# Patient Record
Sex: Female | Born: 1975 | Hispanic: Yes | Marital: Married | Smoking: Never smoker
Health system: Southern US, Community
[De-identification: ages and names within clinical notes are randomized; demographics above are authoritative.]

## PROBLEM LIST (undated history)

## (undated) DIAGNOSIS — Z6841 Body Mass Index (BMI) 40.0 and over, adult: Secondary | ICD-10-CM

## (undated) DIAGNOSIS — O09529 Supervision of elderly multigravida, unspecified trimester: Secondary | ICD-10-CM

## (undated) DIAGNOSIS — O24419 Gestational diabetes mellitus in pregnancy, unspecified control: Secondary | ICD-10-CM

## (undated) SURGERY — Surgical Case
Anesthesia: *Unknown

---

## 1998-11-29 DIAGNOSIS — O321XX0 Maternal care for breech presentation, not applicable or unspecified: Secondary | ICD-10-CM

## 2007-03-31 ENCOUNTER — Observation Stay: Payer: Self-pay | Admitting: Obstetrics and Gynecology

## 2008-09-23 ENCOUNTER — Ambulatory Visit: Payer: Self-pay | Admitting: Internal Medicine

## 2010-02-04 ENCOUNTER — Ambulatory Visit: Payer: Self-pay | Admitting: Internal Medicine

## 2010-03-18 ENCOUNTER — Emergency Department: Payer: Self-pay | Admitting: Emergency Medicine

## 2013-08-08 ENCOUNTER — Emergency Department: Payer: Self-pay | Admitting: Emergency Medicine

## 2013-08-08 LAB — URINALYSIS, COMPLETE
BILIRUBIN, UR: NEGATIVE
Bacteria: NONE SEEN
Blood: NEGATIVE
GLUCOSE, UR: NEGATIVE mg/dL (ref 0–75)
Leukocyte Esterase: NEGATIVE
NITRITE: NEGATIVE
Ph: 6 (ref 4.5–8.0)
Protein: NEGATIVE
Specific Gravity: 1.018 (ref 1.003–1.030)
Squamous Epithelial: 1
WBC UR: <1 /HPF (ref 0–5)

## 2013-08-08 LAB — CBC WITH DIFFERENTIAL/PLATELET
BASOS ABS: 0 10*3/uL (ref 0.0–0.1)
BASOS PCT: 0.3 %
EOS PCT: 0.5 %
Eosinophil #: 0.1 10*3/uL (ref 0.0–0.7)
HCT: 41.8 % (ref 35.0–47.0)
HGB: 13.8 g/dL (ref 12.0–16.0)
LYMPHS ABS: 0.5 10*3/uL — AB (ref 1.0–3.6)
Lymphocyte %: 4.6 %
MCH: 30.2 pg (ref 26.0–34.0)
MCHC: 33 g/dL (ref 32.0–36.0)
MCV: 92 fL (ref 80–100)
MONO ABS: 0.3 x10 3/mm (ref 0.2–0.9)
Monocyte %: 2.7 %
NEUTROS PCT: 91.9 %
Neutrophil #: 9.1 10*3/uL — ABNORMAL HIGH (ref 1.4–6.5)
Platelet: 248 10*3/uL (ref 150–440)
RBC: 4.57 10*6/uL (ref 3.80–5.20)
RDW: 12.4 % (ref 11.5–14.5)
WBC: 9.9 10*3/uL (ref 3.6–11.0)

## 2013-08-08 LAB — COMPREHENSIVE METABOLIC PANEL
Albumin: 3.9 g/dL (ref 3.4–5.0)
Alkaline Phosphatase: 97 U/L
Anion Gap: 4 — ABNORMAL LOW (ref 7–16)
BUN: 11 mg/dL (ref 7–18)
Bilirubin,Total: 0.6 mg/dL (ref 0.2–1.0)
CO2: 29 mmol/L (ref 21–32)
Calcium, Total: 8.8 mg/dL (ref 8.5–10.1)
Chloride: 102 mmol/L (ref 98–107)
Creatinine: 0.76 mg/dL (ref 0.60–1.30)
EGFR (African American): >60
GLUCOSE: 125 mg/dL — AB (ref 65–99)
Osmolality: 271 (ref 275–301)
Potassium: 3.6 mmol/L (ref 3.5–5.1)
SGOT(AST): 162 U/L — ABNORMAL HIGH (ref 15–37)
SGPT (ALT): 144 U/L — ABNORMAL HIGH (ref 12–78)
SODIUM: 135 mmol/L — AB (ref 136–145)
Total Protein: 8.2 g/dL (ref 6.4–8.2)

## 2013-08-08 LAB — LIPASE, BLOOD: Lipase: 122 U/L (ref 73–393)

## 2013-08-08 LAB — PREGNANCY, URINE: PREGNANCY TEST, URINE: NEGATIVE m[IU]/mL

## 2014-03-07 ENCOUNTER — Emergency Department: Payer: Self-pay | Admitting: Emergency Medicine

## 2014-03-07 LAB — URINALYSIS, COMPLETE
BACTERIA: NONE SEEN
BILIRUBIN, UR: NEGATIVE
Blood: NEGATIVE
Glucose,UR: NEGATIVE mg/dL (ref 0–75)
Ketone: NEGATIVE
Leukocyte Esterase: NEGATIVE
Nitrite: NEGATIVE
PH: 7 (ref 4.5–8.0)
Protein: NEGATIVE
RBC, UR: NONE SEEN /HPF (ref 0–5)
SPECIFIC GRAVITY: 1.013 (ref 1.003–1.030)
Squamous Epithelial: 1
WBC UR: 1 /HPF (ref 0–5)

## 2014-03-07 LAB — CBC
HCT: 37 % (ref 35.0–47.0)
HGB: 12.5 g/dL (ref 12.0–16.0)
MCH: 31.2 pg (ref 26.0–34.0)
MCHC: 33.8 g/dL (ref 32.0–36.0)
MCV: 92 fL (ref 80–100)
PLATELETS: 277 10*3/uL (ref 150–440)
RBC: 4.01 10*6/uL (ref 3.80–5.20)
RDW: 13 % (ref 11.5–14.5)
WBC: 11.3 10*3/uL — ABNORMAL HIGH (ref 3.6–11.0)

## 2014-03-07 LAB — GC/CHLAMYDIA PROBE AMP

## 2014-03-07 LAB — HCG, QUANTITATIVE, PREGNANCY: Beta Hcg, Quant.: 26753 m[IU]/mL — ABNORMAL HIGH

## 2014-03-07 LAB — WET PREP, GENITAL

## 2014-03-21 ENCOUNTER — Encounter: Payer: Self-pay | Admitting: Obstetrics and Gynecology

## 2014-04-11 ENCOUNTER — Encounter: Payer: Self-pay | Admitting: Obstetrics and Gynecology

## 2014-04-22 ENCOUNTER — Encounter: Payer: Self-pay | Admitting: Maternal & Fetal Medicine

## 2014-04-30 ENCOUNTER — Ambulatory Visit: Payer: Self-pay | Admitting: Maternal & Fetal Medicine

## 2014-05-09 ENCOUNTER — Encounter: Payer: Self-pay | Admitting: Maternal & Fetal Medicine

## 2014-05-23 ENCOUNTER — Encounter: Payer: Self-pay | Admitting: Obstetrics and Gynecology

## 2014-05-31 ENCOUNTER — Ambulatory Visit: Payer: Self-pay | Admitting: Maternal & Fetal Medicine

## 2014-05-31 DIAGNOSIS — O24419 Gestational diabetes mellitus in pregnancy, unspecified control: Secondary | ICD-10-CM

## 2014-05-31 HISTORY — DX: Gestational diabetes mellitus in pregnancy, unspecified control: O24.419

## 2014-05-31 NOTE — L&D Delivery Note (Signed)
Delivery Note At 3:40 AM a viable female was delivered via VBAC, Spontaneous (Presentation: Right Occiput Anterior ).  APGAR: 8,9 ; weight 7 lb 14 oz (3572 g).   Placenta status: Spontaneous, .  Cord: 3 vessels with the following complications: nuchal and body cord .   Anesthesia: None  Episiotomy: None Lacerations: None (Vagina and cervix explored with no lacerations noted) Suture Repair: None  Est. Blood Loss (mL): 350  Mom to postpartum.  Baby to Couplet care / Skin to Skin.    Margrett Kalb 10/19/2014, 4:40 AM

## 2014-06-20 ENCOUNTER — Encounter: Payer: Self-pay | Admitting: Obstetrics & Gynecology

## 2014-06-25 ENCOUNTER — Encounter: Payer: Self-pay | Admitting: Pediatric Cardiology

## 2014-07-01 ENCOUNTER — Encounter: Payer: Self-pay | Admitting: Obstetrics and Gynecology

## 2014-08-01 ENCOUNTER — Encounter
Admit: 2014-08-01 | Disposition: A | Discharge status: Home / Self Care | Payer: Self-pay | Attending: Maternal & Fetal Medicine | Admitting: Maternal & Fetal Medicine

## 2014-08-26 ENCOUNTER — Encounter
Admit: 2014-08-26 | Disposition: A | Discharge status: Home / Self Care | Payer: Self-pay | Admitting: Maternal & Fetal Medicine

## 2014-08-30 ENCOUNTER — Encounter
Admit: 2014-08-30 | Disposition: A | Discharge status: Home / Self Care | Payer: Self-pay | Attending: Maternal & Fetal Medicine | Admitting: Maternal & Fetal Medicine

## 2014-09-02 ENCOUNTER — Encounter
Admit: 2014-09-02 | Disposition: A | Discharge status: Home / Self Care | Payer: Self-pay | Attending: Obstetrics and Gynecology | Admitting: Obstetrics and Gynecology

## 2014-09-05 ENCOUNTER — Encounter
Admit: 2014-09-05 | Disposition: A | Discharge status: Home / Self Care | Payer: Self-pay | Attending: Maternal & Fetal Medicine | Admitting: Maternal & Fetal Medicine

## 2014-09-09 ENCOUNTER — Encounter
Admit: 2014-09-09 | Disposition: A | Discharge status: Home / Self Care | Payer: Self-pay | Attending: Maternal & Fetal Medicine | Admitting: Maternal & Fetal Medicine

## 2014-09-11 LAB — OB RESULTS CONSOLE ABO/RH: RH Type: POSITIVE

## 2014-09-11 LAB — OB RESULTS CONSOLE VARICELLA ZOSTER ANTIBODY, IGG: VARICELLA IGG: IMMUNE

## 2014-09-11 LAB — OB RESULTS CONSOLE HEPATITIS B SURFACE ANTIGEN: Hepatitis B Surface Ag: NEGATIVE

## 2014-09-11 LAB — OB RESULTS CONSOLE RUBELLA ANTIBODY, IGM: RUBELLA: IMMUNE

## 2014-09-11 LAB — OB RESULTS CONSOLE RPR: RPR: NONREACTIVE

## 2014-09-11 LAB — OB RESULTS CONSOLE GC/CHLAMYDIA
CHLAMYDIA, DNA PROBE: NEGATIVE
Gonorrhea: NEGATIVE

## 2014-09-11 LAB — OB RESULTS CONSOLE ANTIBODY SCREEN: Antibody Screen: NEGATIVE

## 2014-09-11 LAB — OB RESULTS CONSOLE HIV ANTIBODY (ROUTINE TESTING): HIV: NONREACTIVE

## 2014-09-12 ENCOUNTER — Encounter
Admit: 2014-09-12 | Disposition: A | Discharge status: Home / Self Care | Payer: Self-pay | Attending: Obstetrics & Gynecology | Admitting: Obstetrics & Gynecology

## 2014-09-16 ENCOUNTER — Encounter
Admit: 2014-09-16 | Disposition: A | Discharge status: Home / Self Care | Payer: Self-pay | Attending: Obstetrics and Gynecology | Admitting: Obstetrics and Gynecology

## 2014-09-19 ENCOUNTER — Encounter
Admit: 2014-09-19 | Disposition: A | Discharge status: Home / Self Care | Payer: Self-pay | Attending: Maternal & Fetal Medicine | Admitting: Maternal & Fetal Medicine

## 2014-09-21 NOTE — Consult Note (Signed)
Referral Information:  Reason for Referral Gestational diabetes and advanced maternal age - follow up of blood sugars   Referring Physician [facility] Dr. Presley Raddle   Prenatal Hx 39 yo [phone] married Latina female seen with Spanish Interpreter. She is  now at [redacted]w[redacted]d based on LMP 01/05/2014, [facility] 10/22/2014 (ultrasound on 03/07/2014 she was [redacted]w[redacted]d with [facility] 10/19/2013).  She had an early elevated AIC of 6.7 and was referred for management of diabetes this pregnancy (history of GDM prior pregnancy).   Past Obstetrical Hx 1.  2000 cesarean for breech [facility]; no preex or GDM; 8 lbs female 2.  2003 SVD at [facility]; no preex or GDM; 9 lbs female  3.  2009 SVD at [facility], no preex; GDM treated with diet only; 9 lbs female 4.  2013 6 week sab 5.  Current   Home Medications: Medication Instructions Status  Prenatal Multivitamins Prenatal Multivitamins oral tablet 1 tab(s) orally once a day Active   Allergies:   No Known Allergies:   No Known Allergies:   Vital Signs/Notes:  Nursing Vital Signs:  **Vital Signs.:   12-Nov-15 13:16  Vital Signs Type Routine  Temperature Temperature (F) 98.4  Celsius 36.8  Temperature Source oral  Pulse Pulse 75  Respirations Respirations 18  Systolic BP Systolic BP 108  Diastolic BP (mmHg) Diastolic BP (mmHg) 52  Mean BP 70  Pulse Ox % Pulse Ox % 100  Pulse Ox Activity Level  At rest  Oxygen Delivery Room Air/ 21 %   Perinatal Consult:  LMP 15-Jan-2014   PGyn Hx regular menses   Past Medical History cont'd obesity distant h/o chlamydia   PSurg Hx cesarean 2000   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  Gestational DM - likely type 2 with elevated hgb A1c.  BS range widely. 2.  Cesarean x 1 followed by 2 VBAC  of 9 lb infants  3.  Advanced maternal age 63.  Elevated BMI   Recommendations 1.  [name] was referred to [facility] and has tried to make adjustments to her diet (now eating about 3 tortillas daily down from about 8) and has been checking fasting blood sugars and qhs blood sugars.  No postprandials were recorded.  She states that she eats 2 meals/day at work and those meal times are her only breaks so she cannot check her BS during the day. 2.  Will continue checking fasting blood sugars and will start checking 2hr post dinner (she usually eats dinner at home around 5pm) and 2hr post prandials on her 2 days off/week.  She was also encouraged to start walking in the evenings if possible and to continue to monitor her diet (increase her protein and decrease her carbs).  I did NOT start glyburide today although we discussed that it may be necessary.  Will try and get a better assessment of her postprandials on the weekends and evenings before starting. 3.  She desires VBAC - discussed if fetus was over 4.5k that cesarean woudl be recommended  4.  She had GC today and first trimester ultrasound - she declined aneuploidy screening or testing.  Will schedule follow up ultrasound in about 6 weeks for evaluation of fetal anatomy.  Would also recommend screening  fetal echocardiogram at about 22 weeks.  5. She was previously advised her to limit weight gain to 10 lbs given increased BMI. 6. RTC 2 wks to review BS - will [facility]. 7. Offer starting baby ASA next visit.   Plan:  Prenatal Diagnosis Options Level II US   Ultrasound at what gestational ages Monthly > 28 weeks   Antepartum Testing Twice weekly, Starting at 32 weeks, if on medication   Delivery Mode depends on EFW   Additional [facility]   Delivery at what gestational age [redacted] weeks, Or earlier if evidence of fetopathy    Total Time Spent with Patient 30 minutes    >50% of visit spent in couseling/coordination of care yes   Office Use Only 99214  Office Visit Level 4 (25min) EST detailed office/outpt   Coding Description: MATERNAL CONDITIONS/HISTORY INDICATION(S).   Abnormal glucose tolerance - if known by anatomy scan.   Adv. Maternal Age - multigravida.   Previous C-section.  Electronic Signatures: Kirby FunkEllestad, Edman Lipsey (MD)  (Signed 12-Nov-15 15:33)  Authored: Referral, Home Medications, Allergies, Vital Signs/Notes, Consult, Lab/Radiology Notes, Impression, Plan, Billing, Coding Description   Last Updated: 12-Nov-15 15:33 by Kirby FunkEllestad, Shaneika Rossa (MD)

## 2014-09-21 NOTE — Consult Note (Signed)
Referral Information:  Reason for Referral Gestational diabetes and advanced maternal age - follow up of blood sugars   [facility] - Dr. Presley RaddleAdrian Mancheno   Prenatal Hx 39 yo [phone] married Latina female seen with Spanish Interpreter. She is now at 2531w2d based on LMP 01/15/2014, [facility] (On ultrasound 03/07/2014 [facility]  She had an early elevated AIC of 6.7 and was originally referred for management of diabetes this pregnancy (history of GDM prior pregnancy).   Past Obstetrical Hx 1.  2000 ces[facility] for breech [facility]; no preex or GDM; 8 lbs female 2.  [facility]; no preex or GDM; 9 lbs female  3.  [facility], [facility]; GDM treated with diet only; 9 lbs female 4.  2013 6 week sab 5.  Current   Home Medications: Medication Instructions Status  Prenatal Multivitamins Prenatal Multivitamins oral tablet 1 tab(s) orally once a day Active  metFORMIN 500 mg oral tablet 1 tab(s) orally 2 times a day Active  aspirin 81 mg oral tablet, chewable 1 tab(s) orally once a day Active   Allergies:   No Known Allergies:   Vital Signs/Notes:  Nursing Vital Signs: **Vital Signs.:   24-Dec-15 09:05  Vital Signs Type Routine  Temperature Temperature (F) 98.6  Celsius 37  Pulse Pulse 79  Respirations Respirations 18  Systolic BP Systolic BP 97  Diastolic BP (mmHg) Diastolic BP (mmHg) 58  Mean BP 71  Pulse Ox % Pulse Ox % 97  Pulse Ox Activity Level  At rest  Oxygen Delivery Room Air/ 21 %   Perinatal Consult:  LMP 15-Jan-2014   PGyn Hx regular menses   Past Medical History cont'd obesity distant h/o chlamydia   PSurg Hx cesarean 2000   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   Soc Hx married   Review Of Systems:  Fever/Chills No   Cough No   Abdominal Pain No   Diarrhea No   Constipation No   Nausea/Vomiting No   SOB/DOE No   Chest Pain No   Dysuria No   Tolerating Diet Yes   Medications/Allergies Reviewed  Medications/Allergies reviewed   Exam:  Today's Weight 218lb; BMI=35   Abdomen FHR = 140    Additional Lab/Radiology Notes Fasting blood sugars range from 78-111 with 5/15 > 95  Only 4/5 > 95 in the last week 2 hr postprandial breakfast range from 84-108 with 0/5 > 120 2 hr postprandial lunch range from 83-120 with 0/5 > 120 2 hr postprandial dinner range from 87-139 with 1/14 > 120   Impression/Recommendations:  Impression 39 yo G5P3013  at 2331w2d with: 1.  Gestational DM - likely type 2 with elevated hgb A1c.  Blood sugars initially improved on metformin.  Now with fasting hyperglycemia. 2.  Cesarean x 1 followed by 2 VBAC of 9 lb infants  3.  Advanced maternal age 89.  Elevated BMI   Recommendations 1. Gestational DM - likely type 2 with elevated hgb A1c. Blood sugars initially improved on metformin. Now with fasting hyperglycemia.  -  She should continue checking her fasting blood sugars daily and 2 hour postprandial blood sugars periodically.   - She should increase her metformin to 1000 mg po bid. This is the maximum dose.  We can  add insulin prn. - I have recommended she return in 4 weeks - We would also recommend screening fetal echocardiogram at about 22 weeks.  This was already ordered. - She was scheduled to  return here in 10 weeks for her first growth scan - Fetal surveillance as outlined below  2. Cesarean x 1 followed by 2 VBAC of 9 lb infants  -  She desires VBAC - Dr. Quin Hoop previously discussed if fetus was over 4.5k that cesarean would be recommended  3. Advanced maternal age -  She has had genetic counseling and declined any genetic testing. -  She is taking for ASA 81 mg po daily  4. Elevated BMI - She was previously advised her to limit weight gain to 10 lbs given increased BMI. 5. Routine OB care - She should [facility] to [facility]   Plan:  Prenatal Diagnosis Options Level II [place]   Ultrasound at what gestational ages Monthly > 28  weeks   Antepartum Testing Twice weekly, Starting at 32 weeks   Delivery Mode depends on EFW   Additional Testing pt had TFTs at [facility]   Delivery at what gestational age [redacted] weeks, Or earlier (ie 37 weeks) if evidence of fetopathy    Total Time Spent with Patient 15 minutes   >50% of visit spent in couseling/coordination of care yes   Office Use Only 99213  Office Visit Level 3 ( ) EST exp prob focused outpt   Coding Description: MATERNAL CONDITIONS/HISTORY INDICATION(S).   Abnormal glucose tolerance - if known by anatomy scan.   Adv. Maternal Age - multigravida.   Obesity - BMI greater than equal to 30.   Previous C-section.  Electronic Signatures: Lady Deutscher (MD)  (Signed 24-Dec-15 13:34)  Authored: Referral, Home Medications, Allergies, Vital Signs/Notes, Consult, Exam, Lab/Radiology Notes, Impression, Plan, Billing, Coding Description   Last Updated: 24-Dec-15 13:34 by Lady Deutscher (MD)

## 2014-09-21 NOTE — Consult Note (Signed)
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 I saw them with the Spanish interpreter. She is  now at 9w 3d LMP 01/05/2014 , [facility] 10/22/2014 (ultrasound on 03/07/2014 she was 7w 5d [facility] 10/19/2013)  pt has a hgb A1c of 6.7 at [facility].   She had GDM with her last pregnancy - she watched her diet and only checked her blood sugar inthe clinic. she has just started checking her blood sugar ( she is able to purchase strips).  She received soe  diet counseling and is trying to eat better.  She is obese with a BMI of 43 .   Past Obstetrical Hx 2000- cesarean for breech [facility] no preex or GDM 8 lbs female 2003 - spontaneous vaginal delivery 9 lbs female no GDM no preex 2009 spontaneous vaginal delivery 9 lbs no GDM or preex 2013 6 week sab   Home Medications:  Medication Instructions Status  Prenatal Multivitamins Prenatal Multivitamins oral tablet 1 tab(s) orally once a day Active   Allergies:   No Known Allergies:   No Known Allergies:   Vital Signs/Notes:  Nursing Vital Signs:  **Vital Signs.:   22-Oct-15 08:46  Vital Signs Type Routine  Celsius 98.1  Temperature Source oral  Pulse Pulse 69  Respirations Respirations 18  Systolic BP Systolic BP 106  Diastolic BP (mmHg) Diastolic BP (mmHg) 60  Mean BP 75  Pulse Ox % Pulse Ox % 100  Pulse Ox Activity Level  At rest  Oxygen Delivery Room Air/ 21 %   Perinatal Consult:  LMP 15-Jan-2014   PGyn Hx regular menses    Past Medical History cont'd obesity distant h/o chlamydia   PSurg Hx cesarean 2000   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   Soc Hx married   Review Of Systems:  Abdominal Pain No    Tolerating Diet Yes     Additional Lab/Radiology Notes RBS 77 after breakfast  other BS 160, 107, 111, 119 pt recorded   Impression/Recommendations:   Impression IUP at 9 w 3d 1 gest Dm - likely type 2 with elevated hgb A1c good sugars today on check 2 cesarean x1 followed by 2 VBAC of 9 lb infants  3 adv mat age 89 Obesity   Recommendations 1 refer to [facility] for instruction in diet and glucose monitoirng  2 I counseled them  about risks of Diabetes including birth defects, macrosomia, birth trauma, neonatal hypoglycemia , IUFD and encouraged glucose control to decrease risk 3 pt desires VBAC - discussed if fetus was over 4.5k that cesarean woudl be recommended  4 I offered genetic counseling in 3 weeks - pt says she likely is nto interested in any testing for chromosomal problems 5 I advised her to limit weight gain to 10 lbs given increased BMI  6 RTC in 3 weeks after [facility] visit and chance to modify diet reviewed target blood sugars , advised walking other light exercise to assist incontrolling BS and weight gain I did not discuss baby aspirin option given age and diabetes   Plan:  Genetic Counseling yes, next visit   Ultrasound at what gestational ages Monthly >24 weeks   Antepartum Testing Twice weekly, Starting at 32 weeks, if on medication   Delivery Mode depends on EFW   Additional Testing pt [facility]   Delivery at  what gestational age [redacted] weeks    Total Time Spent with Patient 30 minutes   >50% of visit spent in couseling/coordination of care yes   Office Use Only [phone]  Level 2 (30min) NEW office consult exp prob focused   Coding Description: MATERNAL CONDITIONS/HISTORY INDICATION(S).   Abnormal glucose tolerance - if known by anatomy scan.   Adv. Maternal Age - multigravida.   Previous C-section.  Electronic Signatures: Rondall AllegraLivingston, Sheniece Ruggles Gresham (MD)  (Signed 22-Oct-15 16:20)  Authored: Referral, Home Medications, Allergies, Vital Signs/Notes, Consult, Exam, Lab/Radiology Notes, Impression, Plan, Billing, Coding Description   Last Updated: 22-Oct-15 16:20 by Rondall AllegraLivingston, Auther Lyerly  Gresham (MD)

## 2014-09-21 NOTE — Consult Note (Signed)
Referral Information:  Reason for Referral Gestational diabetes and advanced maternal age - follow up of blood sugars   Referring Physician [facility] - Dr. Presley Raddle   Prenatal Hx 39 yo [phone] married Latina female seen with Spanish Interpreter. She is now at [redacted]w[redacted]d based on LMP 01/05/2014, [facility] 10/22/2014 (ultrasound on 03/07/2014 she was [redacted]w[redacted]d with [facility] 10/19/2013).  She had an early elevated AIC of 6.7 and was originally referred for management of diabetes this pregnancy (history of GDM prior pregnancy).   Past Obstetrical Hx 1.  2000 cesarean for breech [facility]; no preex or GDM; 8 lbs female 2.  2003 SVD at [facility]; no preex or GDM; 9 lbs female  3.  2009 SVD at [facility], no preex; GDM treated with diet only; 9 lbs female 4.  2013 6 week sab 5.  Current   Home Medications: Medication Instructions Status  Prenatal Multivitamins Prenatal Multivitamins oral tablet 1 tab(s) orally once a day Active   Allergies:   No Known Allergies:   No Known Allergies:   Vital Signs/Notes:  Nursing Vital Signs: **Vital Signs.:   23-Nov-15 09:13  Vital Signs Type Routine  Temperature Temperature (F) 98.3  Celsius 36.8  Temperature Source oral  Pulse Pulse 70  Respirations Respirations 18  Systolic BP Systolic BP 94  Diastolic BP (mmHg) Diastolic BP (mmHg) 52  Mean BP 66  Pulse Ox % Pulse Ox % 99  Pulse Ox Activity Level  At rest  Oxygen Delivery Room Air/ 21 %   Perinatal Consult:  LMP 15-Jan-2014   PGyn Hx regular menses   Past Medical History cont'd obesity distant h/o chlamydia   PSurg Hx cesarean 2000   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   Soc Hx married   Review Of Systems:  Fever/Chills No   Cough No   Abdominal Pain No   Diarrhea No   Constipation No   Nausea/Vomiting No   SOB/DOE No   Chest Pain No   Dysuria No   Tolerating Diet Yes   Medications/Allergies Reviewed Medications/Allergies reviewed   Exam:  Today's Weight 221lb; BMI=39   Abdomen FHR = 140     Additional Lab/Radiology Notes Fasting blood sugars range from 79-107 with 5/11 > 95 2 hr postprandial breakfast range from 91-169 with 2/4 > 120 2 hr postprandial lunch range from 92-151 with 1/4 > 120 2 hr postprandial dinner range from 88-152 with 5/11 > 120   Impression/Recommendations:  Impression IUP at [redacted]w[redacted]d 1.  Gestational DM - likely type 2 with elevated hgb A1c.  More than 1/3 of blood sugars > target range 2.  Cesarean x 1 followed by 2 VBAC of 9 lb infants  3.  Advanced maternal age 35.  Elevated BMI   Recommendations 1. Gestational DM - likely type 2 with elevated hgb A1c.  More than 1/3 of blood sugars > target range -  Ms. Reniya Mcclees was referred again to [facility] for nutrition counseling.  An appointment was scheduled for 04/30/2014. -  She should continue checking her fasting blood sugars and 2 hour postprandial blood sugars as she is able.   -  I recommended she start a hypoglycemic and based on the recent meta-analysis of RCTs in BMJ (Balsells et al), which found metformin to be superior to glyburide, I started her on metformin 500 mg po bid.   We can increase this to 1000 mg po bid and/or add insulin prn. - We would also recommend screening fetal echocardiogram at about 22 weeks. - She  was scheduled to return here in 2 weeks. - Fetal surveillance as outlined below  2. Cesarean x 1 followed by 2 VBAC of 9 lb infants  -  She desires VBAC - Dr. Quin HoopEllestad previously discussed if fetus was over 4.5k that cesarean would be recommended  3. Advanced maternal age -  She has had genetic counseling and declined any genetic testing. -  She was given a prescription for ASA 81 mg po daily and counseled about the benefits in terms of reduction of adverse pregnancy outcome. -  She is scheduled for detailed anatomy US on 05/23/2014 4. Elevated BMI - She was previously advised her to limit weight gain to 10 lbs given increased BMI. 5. Routine OB care - [facility]   Plan:  Prenatal Diagnosis Options Level II US   Ultrasound at what gestational ages Monthly > 28 weeks   Antepartum Testing Twice weekly, Starting at 32 weeks   Delivery Mode depends on EFW   Additional Testing [facility]   Delivery at what gestational age [redacted] weeks, Or earlier (ie 37 weeks) if evidence of fetopathy    Total Time Spent with Patient 30 minutes   >50% of visit spent in couseling/coordination of care yes   Office Use Only [id number]  Office Visit Level 4 (25min) EST detailed office/outpt   Coding Description: MATERNAL CONDITIONS/HISTORY INDICATION(S).   Abnormal glucose tolerance - if known by anatomy scan.   Adv. Maternal Age - multigravida.   Obesity - BMI greater than equal to 30.   Previous C-section.  Electronic Signatures: Lady DeutscherJames, Silvana Holecek (MD)  (Signed [id number]-Nov-15 12:57)  Authored: Referral, Home Medications, Allergies, Vital Signs/Notes, Consult, Exam, Lab/Radiology Notes, Impression, Plan, Billing, Coding Description   Last Updated: 23-Nov-15 12:57 by Lady DeutscherJames, Genette Huertas (MD)

## 2014-09-21 NOTE — Consult Note (Signed)
Referral Information:  Reason for Referral Gestational diabetes and advanced maternal age - follow up of blood sugars   [facility] Physician [facility] - Dr. Presley RaddleAdrian Mancheno   Prenatal Hx 39 yo [phone] married Latina female seen with Spanish Interpreter. She is now at 3173w2d based on LMP 01/15/2014, [facility] (On ultrasound 03/07/2014 she [facility] 3388w5d [facility]).  She had an early elevated AIC of 6.7 and was originally referred for management of diabetes this pregnancy (history of GDM prior pregnancy).   Past Obstetrical Hx 1.  2000 cesarean for breech [facility]; no preex or GDM; 8 lbs female 2.  [facility]; no preex or GDM; 9 lbs female  3.  [facility]; GDM treated with diet only; 9 lbs female 4.  2013 6 week sab 5.  Current   Home Medications: Medication Instructions Status  Prenatal Multivitamins Prenatal Multivitamins oral tablet 1 tab(s) orally once a day Active  metFORMIN 500 mg oral tablet 1 tab(s) orally 2 times a day Active  aspirin 81 mg oral tablet, chewable 1 tab(s) orally once a day Active   Allergies:   No Known Allergies:   Vital Signs/Notes:  Nursing Vital Signs: **Vital Signs.:   10-Dec-15 15:36  Vital Signs Type Routine  Temperature Temperature (F) 98.6  Celsius 37  Temperature Source oral  Pulse Pulse 70  Respirations Respirations 18  Systolic BP Systolic BP 95  Diastolic BP (mmHg) Diastolic BP (mmHg) 66  Mean BP 75  Pulse Ox % Pulse Ox % 100  Pulse Ox Activity Level  At rest  Oxygen Delivery Room Air/ 21 %   Perinatal Consult:  LMP 15-Jan-2014   PGyn Hx regular menses   Past Medical History cont'd obesity distant h/o chlamydia   PSurg Hx cesarean 2000   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   Soc Hx married   Review Of Systems:  Fever/Chills No   Cough No   Abdominal Pain No   Diarrhea No   Constipation No   Nausea/Vomiting No   SOB/DOE No   Chest Pain No   Dysuria No   Tolerating Diet Yes    Medications/Allergies Reviewed Medications/Allergies reviewed   Exam:  Today's Weight 224lb; BMI=38   Abdomen FHR = 140    Additional Lab/Radiology Notes Fasting blood sugars range from 82-102 with 7/18 > 95  Only 1/7 > 95 in the last week 2 hr postprandial breakfast range from 92-116 with 0/9 > 120 2 hr postprandial lunch range from 92-116 with 0/9 > 120 2 hr postprandial dinner range from 97-132 with 3/16 > 120   Impression/Recommendations:  Impression IUP at 3873w2d 1.  Gestational DM - likely type 2 with elevated hgb A1c.  Blood sugars remarkably improved on metformin 2.  Cesarean x 1 followed by 2 VBAC of 9 lb infants  3.  Advanced maternal age 89.  Elevated BMI   Recommendations 1. Gestational DM - likely type 2 with elevated hgb A1c.   Blood sugars remarkably improved on metformin -  She should continue checking her fasting blood sugars and 2 hour postprandial blood sugars as she is able.   - She should continue  metformin 500 mg po bid.   We can increase this to 1000 mg po bid and/or add insulin prn. - We would also recommend screening fetal echocardiogram at about 22 weeks.  This was ordered for 6-8 weeks from now - She was scheduled to return here on 12/24 - Fetal surveillance as outlined  below  2. Cesarean x 1 followed by 2 VBAC of 9 lb infants  -  She desires VBAC - Dr. Quin Hoop previously discussed if fetus was over 4.5k that cesarean would be recommended  3. Advanced maternal age -  She has had genetic counseling and declined any genetic testing. -  She is taking for ASA 81 mg po daily  -  She is scheduled for detailed anatomy US on 05/23/2014 4. Elevated BMI - She was previously advised her to limit weight gain to 10 lbs given increased BMI. 5. Routine OB care - She should continue to receive care at [facility]   Plan:  Prenatal Diagnosis Options Level II [place]   Ultrasound at what gestational ages Monthly > 28 weeks   Antepartum Testing Twice weekly,  Starting at 32 weeks   Delivery Mode depends on EFW   Additional Testing pt [facility]   Delivery at what gestational age [redacted] weeks, Or earlier (ie 37 weeks) if evidence of fetopathy    Total Time Spent with Patient 30 minutes   >50% of visit spent in couseling/coordination of care yes   Office Use Only 99214  Office Visit Level 4 ( ) EST detailed office/outpt   Coding Description: MATERNAL CONDITIONS/HISTORY INDICATION(S).   Abnormal glucose tolerance - if known by anatomy scan.   Adv. Maternal Age - multigravida.   Obesity - BMI greater than equal to 30.   Previous C-section.  Electronic Signatures: Lady Deutscher (MD)  (Signed 10-Dec-15 17:49)  Authored: Referral, Home Medications, Allergies, Vital Signs/Notes, Consult, Exam, Lab/Radiology Notes, Impression, Plan, Billing, Coding Description   Last Updated: 10-Dec-15 17:49 by Lady Deutscher (MD)

## 2014-09-23 ENCOUNTER — Encounter
Admit: 2014-09-23 | Disposition: A | Discharge status: Home / Self Care | Payer: Self-pay | Attending: Obstetrics & Gynecology | Admitting: Obstetrics & Gynecology

## 2014-09-26 ENCOUNTER — Encounter
Admit: 2014-09-26 | Disposition: A | Discharge status: Home / Self Care | Payer: Self-pay | Attending: Obstetrics and Gynecology | Admitting: Obstetrics and Gynecology

## 2014-09-26 LAB — OB RESULTS CONSOLE GBS: GBS: NEGATIVE

## 2014-09-29 NOTE — Consult Note (Signed)
Referral Information:  Reason for Referral 39 year-old G5 P3013 at 1234 6/7 weeks by LMP and 8247w5d US (EDD 10/22/2014) here for antenatal testing and return MFM consult to review her sugar log. She has been taking metformin 1000 mg twice daily, NPH insulin 10 units at bedtime and HumaLog 6 units with breakfast.   Referring Physician Dr. Valentino Saxonherry at [facility]   [facility]   Last fetal growth scan 08/26/2014 - EFW 31%tile Normal Fetal echo  BMI 43   Past Obstetrical Hx Para 3 One cesarean for breech followed by 2 VBACs  Sab x1   Home Medications: Medication Instructions Status  Prenatal Multivitamins Prenatal Multivitamins oral tablet 1 tab(s) orally once a day Active  aspirin 81 mg oral tablet, chewable 1 tab(s) orally once a day Active  metFORMIN 1000 mg oral tablet 1  orally 2 times a day Active  HumaLOG 6 unit(s) subcutaneous once a day (in the morning) Active  insulin isophane (NPH) 10 unit(s) subcutaneous once a day (in the evening) Active  HumaLOG 4 unit(s) subcutaneous once a day (before a meal) IN THE AFTERNOON Active   Allergies:   No Known Allergies:   Vital Signs/Notes:  Nursing Vital Signs: **Vital Signs.:   18-Apr-16 15:52  Vital Signs Type Routine  Temperature Temperature (F) 98.4  Celsius 36.8  Pulse Pulse 85  Respirations Respirations 18  Systolic BP Systolic BP 108  Diastolic BP (mmHg) Diastolic BP (mmHg) 46  Mean BP 66  Pulse Ox % Pulse Ox % 98  Pulse Ox Activity Level  At rest  Oxygen Delivery Room Air/ 21 %   Perinatal Consult:  PSurg Hx cesarean    Additional Lab/Radiology Notes Ultrasound limited to position and AFI. Cephalic  AFI=12.4  NST was reactive with baseline 128, moderate variability, presence of 15x15 accels, absence of decels and minimal uterine activity. Reactive NST blood sugars reviewed  Fasting 83-92  2 hpp 109 -148  (3/8 over 120) - only one over 130 - pt confesses at work she had a baby  shower  continue current mgt   Impression/Recommendations:  Impression 39 year-old G5 P3013 at 2334 6/7 weeks with likely type II diabetes here for antenatal testing and to review her sugar log. History of cesarean delivery followed by VBAC x 2  S/P Normal anatatomy scan/ fetal echo.   Recommendations -Continue Metformin 1000 mg every 12 hours- pt has refills  -Take NPH insulin 10 units at bedtime, the original dose prescribed, take 6 units Humalog with breakfast and now take 4 units of Humalog with dinner -RTC 1 week to review BSs -Patient has appts for her fetal surveillance which has been outlined below refill for syringes given   Plan:  Ultrasound at what gestational ages Monthly > 28 weeks   Antepartum Testing Twice weekly   Delivery at what gestational age 38-39 weeks depending on fetal growth    Total Time Spent with Patient 15 minutes   >50% of visit spent in couseling/coordination of care yes   Office Use Only 99214  Office Visit Level 4 (25min) EST detailed office/outpt   Coding Description: MATERNAL CONDITIONS/HISTORY INDICATION(S).   Diabetes - DM.  Electronic Signatures: Rondall AllegraLivingston, Terrace Fontanilla Gresham (MD)  (Signed 18-Apr-16 16:47)  Authored: Referral, Home Medications, Allergies, Vital Signs/Notes, Consult, Lab/Radiology Notes, Impression, Plan, Billing, Coding Description   Last Updated: 18-Apr-16 16:47 by Rondall AllegraLivingston, Wei Poplaski Gresham (MD)

## 2014-09-29 NOTE — Consult Note (Signed)
Referral Information:  Reason for Referral Gestational diabetes likey Type 2 diabetes on metformin in pregnancy -Followup MFM Consult to review sugar log   [facility] Dr Ephraim HamburgerMancheno   Prenatal Hx 39 year-old G5 P3013 at 24 2/7 weeks here for return MFM consult to review her sugar log. She was seen with a Research officer, trade unionpanish Interpreter.  She is currently taking metformin 1000 mg twice daily.  She has seen a nutritionist and has not gained much weight this pregnancy.  [facility] , f/u scan 3/3 /2016 Normal Fetal echo done last week. [facility]  BMI 43   Past Obstetrical Hx para3 one cesarean for breech followed by 2 VBACs  Sab x1   Home Medications: Medication Instructions Status  Prenatal Multivitamins Prenatal Multivitamins oral tablet 1 tab(s) orally once a day Active  aspirin 81 mg oral tablet, chewable 1 tab(s) orally once a day Active  metFORMIN 1000 mg oral tablet 1  orally 2 times a day Active   Allergies:   No Known Allergies:   Vital Signs/Notes:  Nursing Vital Signs: **Vital Signs.:   01-Feb-16 15:30  Vital Signs Type Routine  Temperature Temperature (F) 98.9  Celsius 37.1  Pulse Pulse 78  Respirations Respirations 18  Systolic BP Systolic BP 88  Diastolic BP (mmHg) Diastolic BP (mmHg) 51  Mean BP 63  Pulse Ox % Pulse Ox % 97  Pulse Ox Activity Level  At rest  Oxygen Delivery Room Air/ 21 %    15:41  Systolic BP Systolic BP 91  Diastolic BP (mmHg) Diastolic BP (mmHg) 54  Mean BP 66   Perinatal Consult:  PSurg Hx cesarean    Additional Lab/Radiology Notes Blood sugars since last visit:  FS: Mostly 80-94 none over 100 (14 values) 2hr PP Breakfst: 112, 91 2hr PP Lunch: 88,15 2hr PP dinner: 93-156 (2/12 over 120)   Impression/Recommendations:  Impression 39 year-old G5 P3013 at 24 2/7 weeks with likely type II diabetes here to review her sugar log. Normal anat scan/echo.    Recommendations -Continue Metformin 1000 mg every 12 hours- pt has refills  -RTC 2 weeks to review log- pt may continues to check midday BS only periodically   See MFM consult notes dated 05/23/14, 05/09/14, and 04/22/14 for full pregnancy plan. I completed FMLA paperwork for her pt prefers late day appts so she can avoid missing work    Total Time Spent with Patient 15 minutes   >50% of visit spent in couseling/coordination of care yes   Office Use Only 99213  Office Visit Level 3 (15min) EST exp prob focused outpt   Electronic Signatures: Rondall AllegraLivingston, Lovell Nuttall Gresham (MD)  (Signed 01-Feb-16 17:30)  Authored: Referral, Home Medications, Allergies, Vital Signs/Notes, Consult, Lab/Radiology Notes, Impression, Billing   Last Updated: 01-Feb-16 17:30 by Rondall AllegraLivingston, Iley Breeden Gresham (MD)

## 2014-09-29 NOTE — Consult Note (Signed)
Referral Information:  Reason for Referral Gestational diabetes likey Type 2 diabetes on metformin in pregnancy - Followup MFM Consult to review sugar log   [facility] Dr Ephraim HamburgerMancheno   Prenatal Hx 39 year-old G5 P3013 at 4428 2/7 weeks here for return MFM consult to review her sugar log. She was seen with a Research officer, trade unionpanish Interpreter.  She is currently taking metformin 1000 mg twice daily.  he has seen a nutritionist and has not gained much weight this pregnancy.  [facility]  f/u scan 3/3 /2016 Normal Fetal echo done last week. [facility]  BMI 43   Past Obstetrical Hx para3 one cesarean for breech followed by 2 VBACs  Sab x1   Home Medications: Medication Instructions Status  Prenatal Multivitamins Prenatal Multivitamins oral tablet 1 tab(s) orally once a day Active  aspirin 81 mg oral tablet, chewable 1 tab(s) orally once a day Active  metFORMIN 1000 mg oral tablet 1  orally 2 times a day Active   Allergies:   No Known Allergies:   Vital Signs/Notes:  Nursing Vital Signs: **Vital Signs.:   03-Mar-16 15:29  Vital Signs Type Admission  Temperature Temperature (F) 98.3  Celsius 36.8  Temperature Source oral  Pulse Pulse 74  Respirations Respirations 18  Systolic BP Systolic BP 120  Diastolic BP (mmHg) Diastolic BP (mmHg) 59  Mean BP 79  Pulse Ox % Pulse Ox % 100  Pulse Ox Activity Level  At rest  Oxygen Delivery Room Air/ 21 %   Perinatal Consult:  PSurg Hx cesarean    Additional Lab/Radiology Notes Blood sugars since last visit:  FBS: 84-115 15/32 > 95 2hr PP Breakfst: 91-105  0/4 > 120 2hr PP Lunch: 89-125 1/5 > 120 2hr PP dinner: 93-154  13/29 > 120   Impression/Recommendations:  Impression 39 year-old G5 P3013 at 28 2/7 weeks with likely type II diabetes here to review her sugar log. S/P Normal anatatomy scan/ fetal echo.   Recommendations -Continue Metformin 1000 mg every 12  hours- pt has refills  -Add NPH insulin 10 units at bedtime and 4 units Humalog or Novalog with dinner -RTC 2 weeks to review log- pt may continues to check midday BS only periodically   See MFM consult notes dated 05/23/14, 05/09/14, and 04/22/14 for full list of recommendations and recommendations for fetal surveillance.   Plan:  Ultrasound at what gestational ages Monthly > 28 weeks    Total Time Spent with Patient 15 minutes   >50% of visit spent in couseling/coordination of care yes   Office Use Only 99213  Office Visit Level 3 (15min) EST exp prob focused outpt   Coding Description: MATERNAL CONDITIONS/HISTORY INDICATION(S).   Diabetes - DM.  Electronic Signatures: Lady DeutscherJames, Kosisochukwu Goldberg (MD)  (Signed 03-Mar-16 17:07)  Authored: Referral, Home Medications, Allergies, Vital Signs/Notes, Consult, Lab/Radiology Notes, Impression, Plan, Billing, Coding Description   Last Updated: 03-Mar-16 17:07 by Lady DeutscherJames, Jaymeson Mengel (MD)

## 2014-09-29 NOTE — Consult Note (Signed)
Referral Information:  Reason for Referral 39 year-old G5 P3013 at [redacted]w[redacted]d gestation by LMP and [redacted]w[redacted]d [place] (EDD 10/22/2014) here for antenatal testing and return MFM consult to review her sugar log. She has been taking metformin 1000 mg twice daily, NPH insulin 10 units at bedtime, HumaLog 6 units with breakfast and 4 units with dinner.   Referring Physician Dr. Valentino Saxon at [facility]   Prenatal Hx [facility]   Last fetal growth scan 09/23/2014 - EFW 81%tile (AC measuring 2 weeks ahead) Normal Fetal echocardiogram BMI 43   Past Obstetrical Hx Para 3 One cesarean for breech followed by 2 VBACs  Sab x1   Home Medications: Medication Instructions Status  Prenatal Multivitamins Prenatal Multivitamins oral tablet 1 tab(s) orally once a day Active  aspirin 81 mg oral tablet, chewable 1 tab(s) orally once a day Active  metFORMIN 1000 mg oral tablet 1  orally 2 times a day Active  HumaLOG 6 unit(s) subcutaneous once a day (in the morning) Active  insulin isophane (NPH) 10 unit(s) subcutaneous once a day (in the evening) Active  HumaLOG 4 unit(s) subcutaneous once a day (before a meal) IN THE AFTERNOON Active   Allergies:   No Known Allergies:   Vital Signs/Notes:  Nursing Vital Signs: **Vital Signs.:   28-Apr-16 15:45  Vital Signs Type Routine  Temperature Temperature (F) 98.4  Celsius 36.8  Temperature Source oral  Pulse Pulse 79  Respirations Respirations 18  Systolic BP Systolic BP 101  Diastolic BP (mmHg) Diastolic BP (mmHg) 69  Mean BP 79  Pulse Ox % Pulse Ox % 98  Pulse Ox Activity Level  At rest  Oxygen Delivery Room Air/ 21 %    Additional Lab/Radiology Notes Blood sugars since 09/10/2014: FBS: 83-102 4/17 > 95 2hr PP Breakfst: 110, 112 2hr PP Lunch: 109, 115 2hr PP dinner: 94-148  5/16 > 120  NR NST today with BPP of 8/8, cephalic, AFI = 9.9 cm  [place] today - cephalic, AFI = 15.1 cm, MVP = 4.6 cm   Impression/Recommendations:  Impression 39 year-old G5 P3013 at 6 2/7 weeks with likely type II diabetes here for antenatal testing and to review her sugar log. History of cesarean delivery followed by VBAC x 2  - S/P Normal anatomy scan/ fetal echo. - recent increase in FBS. - last EFW on Monday 81st% with AC measuring 2 weeks ahead.   Recommendations -Continue Metformin 1000 mg every 12 hours- pt has refills  -Continute to take NPH insulin 10 units at bedtime, 6 units Humalog with breakfast and  4 units of Humalog with dinner -RTC 1 week to review BSs -I encouraged her to eat a snack before bed as she typically eats dinner around 4:30pm, goes to bed at 10 and doesn't wake up until about 5:30.  She has recently been waking up sweaty and shaky but doesn't take her BS.  I am wondering if she is having some rebound hyperglycemia in the am due to fasting for so long and becoming hypoglycemic during the night.  Discussed appropriate snacks to try and encouraged her to have something on hand should she be very low. -Continue 2x weekly NSTs, weekly AFI and recommend delivery between 37-39 weeks.  Given increase in EFW and increasing FBS, would consider delivery in the next 2 weeks. This was communicated to the patient with the help of a Research officer, trade union.   Plan:  Antepartum Testing Twice weekly   Delivery at what gestational age  37-39 weeks depending on fetal growth   Comment/Plan Thank you for allowing us to participate in her care.    Total Time Spent with Patient 15 minutes   >50% of visit spent in couseling/coordination of care yes   Office Use Only 99213  Office Visit Level 3 (15min) EST exp prob focused outpt   Coding Description: MATERNAL CONDITIONS/HISTORY INDICATION(S).   Diabetes - DM.  Electronic Signatures: Kirby FunkEllestad, Kahiau Schewe (MD)  (Signed 28-Apr-16 16:52)  Authored: Referral, Home Medications, Allergies, Vital Signs/Notes, Lab/Radiology Notes, Impression, Plan, Billing, Coding Description   Last Updated: 28-Apr-16  16:52 by Kirby FunkEllestad, Maryon Kemnitz (MD)

## 2014-09-29 NOTE — Consult Note (Signed)
Referral Information:  Reason for Referral 39 year-old G5 P3013 at [redacted]w[redacted]d gestation by LMP and 7360w5d US (EDD 10/22/2014) here for antenatal testing and return MFM consult to review her sugar log. She has been taking metformin 1000 mg twice daily, NPH insulin 10 units at bedtime, HumaLog 6 units with breakfast and 4 units with dinner.   Referring Physician Dr. Valentino Saxonherry at [facility]   Prenatal Hx [facility]   Last fetal growth scan 08/26/2014 - EFW 31%tile Normal Fetal echo [facility]  BMI 43   Past Obstetrical Hx Para 3 One cesarean for breech followed by 2 VBACs  Sab x1   Home Medications: Medication Instructions Status  Prenatal Multivitamins Prenatal Multivitamins oral tablet 1 tab(s) orally once a day Active  aspirin 81 mg oral tablet, chewable 1 tab(s) orally once a day Active  metFORMIN 1000 mg oral tablet 1  orally 2 times a day Active  HumaLOG 6 unit(s) subcutaneous once a day (in the morning) Active  insulin isophane (NPH) 10 unit(s) subcutaneous once a day (in the evening) Active  HumaLOG 4 unit(s) subcutaneous once a day (before a meal) IN THE AFTERNOON Active   Allergies:   No Known Allergies:   Vital Signs/Notes:  Nursing Vital Signs: **Vital Signs.:   21-Apr-16 16:00  Vital Signs Type Routine  Temperature Temperature (F) 98.4  Celsius 36.8  Pulse Pulse 77  Respirations Respirations 18  Systolic BP Systolic BP 100  Diastolic BP (mmHg) Diastolic BP (mmHg) 54  Mean BP 69  Pulse Ox % Pulse Ox % 98  Pulse Ox Activity Level  At rest  Oxygen Delivery Room Air/ 21 %   Perinatal Consult:  PSurg Hx cesarean    Additional Lab/Radiology Notes Blood sugars since 09/09/2014: FBS: 83-91 0/10 > 95 2hr PP Breakfst: 110 2hr PP Lunch: 109 2hr PP dinner: 111-148  3/9 > 120  NR NST today with BPP of 8/8, cephalic, AFI = 9.9 cm  US today - cephalic, AFI = 15.1 cm, MVP = 4.6 cm    Impression/Recommendations:  Impression 39 year-old G5 P3013 at 38 2/7 weeks with likely type II diabetes here for antenatal testing and to review her sugar log. History of cesarean delivery followed by VBAC x 2  S/P Normal anatatomy scan/ fetal echo.   Recommendations -Continue Metformin 1000 mg every 12 hours- pt has refills  -Continute to take NPH insulin 10 units at bedtime, 6 units Humalog with breakfast and  4 units of Humalog with dinner -RTC 1 week to review BSs -Patient has appts for her fetal surveillance which has been outlined below:   Plan:  Prenatal Diagnosis Options Next US for growth is 09/23/2014   Ultrasound at what gestational ages Monthly > 28 weeks   Antepartum Testing Twice weekly   Delivery at what gestational age 38-39 weeks depending on fetal growth   Comment/Plan Thank you for allowing us to participate in her care.    Total Time Spent with Patient 15 minutes   >50% of visit spent in couseling/coordination of care yes   Office Use Only 99213  Office Visit Level 3 (15min) EST exp prob focused outpt   Coding Description: MATERNAL CONDITIONS/HISTORY INDICATION(S).   Diabetes - DM.  Electronic Signatures: Lady DeutscherJames, Maryuri Warnke (MD)  (Signed 21-Apr-16 17:59)  Authored: Referral, Home Medications, Allergies, Vital Signs/Notes, Consult, Lab/Radiology Notes, Impression, Plan, Billing, Coding Description   Last Updated: 21-Apr-16 17:59 by Lady DeutscherJames, Namrata Dangler (MD)

## 2014-09-29 NOTE — Consult Note (Signed)
Referral Information:  Reason for Referral NST   Allergies:   No Known Allergies:    Comments NST results: Baseline 130's Moderate variability Reactive No decels Toco irritable without contractions.    Office Use Only [id number]  Fetal Non-Stress Test   Electronic Signatures: Dareon Nunziato, [place] (MD)  (Signed 14-Apr-16 17:19)  Authored: Referral, Allergies, Other Comments, Billing   Last Updated: 14-Apr-16 17:19 by Kynzee Devinney, [place] (MD)

## 2014-09-29 NOTE — Consult Note (Signed)
Referral Information:  Reason for Referral 39 year-old G5 P3013 at 833 6/7 weeks by LMP and 383w5d US (EDD 10/22/2014) here for antenatal testing and return MFM consult to review her sugar log. She has been taking metformin 1000 mg twice daily, NPH insulin 10 units at bedtime and HumaLog 6 units with breakfast.   Referring Physician Dr. Valentino Saxonherry at [facility]   Prenatal Hx [facility]  [facility]   Last fetal growth scan 08/26/2014 - EFW 31%tile Normal Fetal echo [facility]  BMI 43   Past Obstetrical Hx Para 3 One cesarean for breech followed by 2 VBACs  Sab x1   Home Medications: Medication Instructions Status  Prenatal Multivitamins Prenatal Multivitamins oral tablet 1 tab(s) orally once a day Active  aspirin 81 mg oral tablet, chewable 1 tab(s) orally once a day Active  metFORMIN 1000 mg oral tablet 1  orally 2 times a day Active  HumaLOG 6 unit(s) subcutaneous once a day (in the morning) Active  insulin isophane (NPH) 10 unit(s) subcutaneous once a day (in the evening) Active   Allergies:   No Known Allergies:   Vital Signs/Notes:  Nursing Vital Signs: **Vital Signs.:   11-Apr-16 16:05  Vital Signs Type Routine  Temperature Temperature (F) 98.1  Celsius 36.7  Pulse Pulse 75  Respirations Respirations 18  Systolic BP Systolic BP 106  Diastolic BP (mmHg) Diastolic BP (mmHg) 45  Mean BP 65  Pulse Ox % Pulse Ox % 98  Pulse Ox Activity Level  At rest  Oxygen Delivery Room Air/ 21 %   Perinatal Consult:  PSurg Hx cesarean    Additional Lab/Radiology Notes Blood sugars this past week:  FBS: 84-107 1/8 > 95 2hr PP Breakfst: 105 2hr PP Lunch: 107 2hr PP dinner: 118-127  3/7 > 120  US today - cephalic, AFI = 15.1 cm, MVP = 4.6 cm Reactive NST today   Impression/Recommendations:  Impression 39 year-old G5 P3013 at 38 2/7 weeks with likely type II diabetes here for antenatal testing and to review her sugar log.  History of cesarean delivery followed by VBAC x 2  S/P Normal anatatomy scan/ fetal echo.   Recommendations -Continue Metformin 1000 mg every 12 hours- pt has refills  -Take NPH insulin 10 units at bedtime, the original dose prescribed, take 6 units Humalog with breakfast and now take 4 units of Humalog with dinner -RTC 1 week to review BSs -Patient has appts for her fetal surveillance which has been outlined below:   Plan:  Ultrasound at what gestational ages Monthly > 28 weeks   Antepartum Testing Twice weekly   Delivery at what gestational age 387-39 weeks depending on fetal growth    Total Time Spent with Patient 30 minutes   >50% of visit spent in couseling/coordination of care yes   Office Use Only 99214  Office Visit Level 4 (25min) EST detailed office/outpt   Coding Description: MATERNAL CONDITIONS/HISTORY INDICATION(S).   Diabetes - DM.  Electronic Signatures: Lady DeutscherJames, Genea Rheaume (MD)  (Signed 11-Apr-16 17:32)  Authored: Referral, Home Medications, Allergies, Vital Signs/Notes, Consult, Lab/Radiology Notes, Impression, Plan, Billing, Coding Description   Last Updated: 11-Apr-16 17:32 by Lady DeutscherJames, Darly Massi (MD)

## 2014-09-29 NOTE — Consult Note (Signed)
Referral Information:  Reason for Referral Followup MFM Consult to review sugar log   Referring Physician [facility]   Prenatal Hx 39 year-old G5 P3013 at 4622 2/7 weeks here for return MFM consult to review her sugar log. She was seen with a Research officer, trade unionpanish Interpreter.  She is currently taking metformin 1000 mg twice daily.  She has seen a nutritionist and has identified foods which increase her sugars, which she is avoiding.  [facility] Fetal [facility] Growth [facility]   Home Medications: Medication Instructions Status  Prenatal Multivitamins Prenatal Multivitamins oral tablet 1 tab(s) orally once a day Active  metFORMIN 500 mg oral tablet 1 tab(s) orally 2 times a day Active  aspirin 81 mg oral tablet, chewable 1 tab(s) orally once a day Active   Allergies:   No Known Allergies:   Vital Signs/Notes:  Nursing Vital Signs: **Vital Signs.:   21-Jan-16 15:30  Temperature Temperature (F) 98.4  Pulse Pulse 72  Systolic BP Systolic BP 100  Diastolic BP (mmHg) Diastolic BP (mmHg) 41  Pulse Ox % Pulse Ox % 100    Additional Lab/Radiology Notes Blood sugars since last visit:  FS: Mostly 80-90s, occation 110's 2hr PP Breakfst: 112, 133, 104, 100, 1005 2hr PP Lunch: 96, 99, 106, 121, 111, 128, 120 2hr PP dinner: mostly 110-130   Impression/Recommendations:  Impression 39 year-old G5 P3013 at 22 2/7 weeks with type II diabetes here to review her sugar log. Normal anat scan.   Recommendations -Fetal echo is scheduled for jan 26th -Continue Metformin 1000 mg every 12 hours -RTC 2 weeks to reveiw log  See MFM consult notes dated 05/23/14, 05/09/14, and 04/22/14 for full pregnancy plan.    Total Time Spent with Patient 15 minutes   >50% of visit spent in couseling/coordination of care yes   Office Use Only 99213  Office Visit Level 3 (15min) EST exp prob focused outpt   Electronic  Signatures: Buena Boehm, [place] (MD)  (Signed 21-Jan-16 16:48)  Authored: Referral, Home Medications, Allergies, Vital Signs/Notes, Lab/Radiology Notes, Impression, Billing   Last Updated: 21-Jan-16 16:48 by Alcario Tinkey, [place] (MD)

## 2014-09-30 ENCOUNTER — Ambulatory Visit
Admission: RE | Admit: 2014-09-30 | Discharge: 2014-09-30 | Disposition: A | Discharge status: Home / Self Care | Payer: No Typology Code available for payment source | Source: Ambulatory Visit | Attending: Maternal & Fetal Medicine | Admitting: Maternal & Fetal Medicine

## 2014-09-30 ENCOUNTER — Other Ambulatory Visit: Payer: Self-pay | Admitting: Maternal & Fetal Medicine

## 2014-09-30 DIAGNOSIS — O24913 Unspecified diabetes mellitus in pregnancy, third trimester: Secondary | ICD-10-CM

## 2014-09-30 DIAGNOSIS — O09523 Supervision of elderly multigravida, third trimester: Secondary | ICD-10-CM

## 2014-09-30 DIAGNOSIS — Z3A36 36 weeks gestation of pregnancy: Secondary | ICD-10-CM | POA: Diagnosis not present

## 2014-10-03 ENCOUNTER — Ambulatory Visit
Admission: RE | Admit: 2014-10-03 | Discharge: 2014-10-03 | Disposition: A | Discharge status: Home / Self Care | Payer: No Typology Code available for payment source | Source: Ambulatory Visit | Attending: Obstetrics and Gynecology | Admitting: Obstetrics and Gynecology

## 2014-10-03 DIAGNOSIS — E119 Type 2 diabetes mellitus without complications: Secondary | ICD-10-CM | POA: Diagnosis not present

## 2014-10-03 DIAGNOSIS — O24919 Unspecified diabetes mellitus in pregnancy, unspecified trimester: Secondary | ICD-10-CM | POA: Insufficient documentation

## 2014-10-03 DIAGNOSIS — Z3A37 37 weeks gestation of pregnancy: Secondary | ICD-10-CM | POA: Insufficient documentation

## 2014-10-03 DIAGNOSIS — O24113 Pre-existing diabetes mellitus, type 2, in pregnancy, third trimester: Secondary | ICD-10-CM | POA: Diagnosis not present

## 2014-10-03 NOTE — Progress Notes (Signed)
Nonstress test done for dx of T2DM in pregnancy NST nonreactiv e Baseline 140  Moderate variability  one accel No decels  Irritability   BPP 8/10  Robin RalphElizabeth Myley Bahner MD

## 2014-10-07 ENCOUNTER — Ambulatory Visit: Payer: No Typology Code available for payment source | Attending: Maternal & Fetal Medicine

## 2014-10-10 ENCOUNTER — Ambulatory Visit
Admission: RE | Admit: 2014-10-10 | Discharge: 2014-10-10 | Disposition: A | Discharge status: Home / Self Care | Payer: No Typology Code available for payment source | Source: Ambulatory Visit | Attending: Maternal & Fetal Medicine | Admitting: Maternal & Fetal Medicine

## 2014-10-10 VITALS — BP 99/58 | HR 70 | Ht 64.0 in | Wt 216.0 lb

## 2014-10-10 DIAGNOSIS — O09523 Supervision of elderly multigravida, third trimester: Secondary | ICD-10-CM | POA: Diagnosis not present

## 2014-10-10 DIAGNOSIS — O24113 Pre-existing diabetes mellitus, type 2, in pregnancy, third trimester: Secondary | ICD-10-CM | POA: Diagnosis not present

## 2014-10-10 DIAGNOSIS — O99213 Obesity complicating pregnancy, third trimester: Secondary | ICD-10-CM

## 2014-10-10 DIAGNOSIS — O09529 Supervision of elderly multigravida, unspecified trimester: Secondary | ICD-10-CM | POA: Insufficient documentation

## 2014-10-10 DIAGNOSIS — O3421 Maternal care for scar from previous cesarean delivery: Secondary | ICD-10-CM

## 2014-10-10 DIAGNOSIS — O34219 Maternal care for unspecified type scar from previous cesarean delivery: Secondary | ICD-10-CM | POA: Insufficient documentation

## 2014-10-10 NOTE — Progress Notes (Signed)
39 year-old G5 P3013 at 7275w2d gestation by LMP and 9779w5d US (EDD 10/22/2014) here for antenatal testing and return MFM consult to review her sugar log. She has been taking metformin 1000 mg twice daily, NPH insulin 12 units at bedtime, HumaLog 6 units with breakfast and 4 units with dinner.   BP 99/58 mmHg  Pulse 70  Ht 5\' 4"  (1.626 m)  Wt 216 lb (97.977 kg)  BMI 37.06 kg/m2  SpO2 98%   RNST today.  Baseline FHR 130 bpm.  No decels.  No contractions.  Last US for growth here:  Cephalic, EFW 3110 g, 81%tile  US yesterday at Dr. Oretha Milchherry's office:  Cephalic, AFI = 13.3 cm  Current Outpatient Prescriptions on File Prior to Encounter  Medication Sig Dispense Refill  . aspirin 81 MG chewable tablet Chew 81 mg by mouth daily.    . insulin glargine (LANTUS) 100 UNIT/ML injection Inject 6 Units into the skin every morning.    . insulin glargine (LANTUS) 100 UNIT/ML injection Inject 4 Units into the skin daily before supper.    . insulin NPH Human (HUMULIN N,NOVOLIN N) 100 UNIT/ML injection 12 Units at bedtime.     . Prenatal Vit-Fe Fumarate-FA (MULTIVITAMIN-PRENATAL) 27-0.8 MG TABS tablet Take 1 tablet by mouth.     No current facility-administered medications on file prior to encounter.    Blood sugars since 10/03/2014: FBS: 79-102   2/7 > 95 2hr PP Breakfst: 115 2hr PP Lunch: none (previously normal) 2hr PP dinner: 87-120 0/6 > 120  Impression/Recommendations:  39 year-old G5 P3013 at 2375w2d gestation with:  Advanced maternal age in multigravida  Maternal morbid obesity in third trimester, antepartum  DM (diabetes mellitus) in pregnancy --continue present regimen -- deliver at 39 weeks -- take NPH insulin the night before induction -- hold insulin the day of deliver and thereafter -- continue metformin indefinitely -- follow-up PP with a primary care physician  Previous cesarean (first delivery for breech) followed by 2 VBACs   I spoke with Dr. Valentino Saxonherry who will see the patient in one  week.  If she has not delivered, Dr. Valentino Saxonherry will refer the patient for delivery then at 2631w2d.  The patient is already 3 cm dilated and should be able to be delivered by ROM.  I spent 30 minutes in consultation, more than half of which was spent counseling the patient and coordinating her care.  Argentina PonderAndra H. Humna Moorehouse, MD [facility]

## 2014-10-14 ENCOUNTER — Ambulatory Visit
Admission: RE | Admit: 2014-10-14 | Discharge: 2014-10-14 | Disposition: A | Discharge status: Home / Self Care | Payer: No Typology Code available for payment source | Source: Ambulatory Visit | Attending: Maternal & Fetal Medicine | Admitting: Maternal & Fetal Medicine

## 2014-10-14 VITALS — BP 114/60 | HR 67 | Temp 98.2°F | Wt 218.0 lb

## 2014-10-14 DIAGNOSIS — O24913 Unspecified diabetes mellitus in pregnancy, third trimester: Secondary | ICD-10-CM

## 2014-10-14 DIAGNOSIS — O09523 Supervision of elderly multigravida, third trimester: Secondary | ICD-10-CM | POA: Insufficient documentation

## 2014-10-14 DIAGNOSIS — O34219 Maternal care for unspecified type scar from previous cesarean delivery: Secondary | ICD-10-CM

## 2014-10-14 DIAGNOSIS — O24919 Unspecified diabetes mellitus in pregnancy, unspecified trimester: Secondary | ICD-10-CM | POA: Insufficient documentation

## 2014-10-14 DIAGNOSIS — O99213 Obesity complicating pregnancy, third trimester: Secondary | ICD-10-CM

## 2014-10-17 ENCOUNTER — Other Ambulatory Visit: Payer: Self-pay | Admitting: Obstetrics and Gynecology

## 2014-10-17 ENCOUNTER — Ambulatory Visit
Admission: RE | Admit: 2014-10-17 | Discharge: 2014-10-17 | Disposition: A | Discharge status: Home / Self Care | Payer: No Typology Code available for payment source | Source: Ambulatory Visit | Attending: Obstetrics and Gynecology | Admitting: Obstetrics and Gynecology

## 2014-10-17 VITALS — BP 97/55 | HR 66 | Temp 98.4°F | Ht 64.0 in | Wt 217.0 lb

## 2014-10-17 DIAGNOSIS — O99213 Obesity complicating pregnancy, third trimester: Secondary | ICD-10-CM

## 2014-10-17 DIAGNOSIS — O34219 Maternal care for unspecified type scar from previous cesarean delivery: Secondary | ICD-10-CM

## 2014-10-17 DIAGNOSIS — O24919 Unspecified diabetes mellitus in pregnancy, unspecified trimester: Secondary | ICD-10-CM | POA: Insufficient documentation

## 2014-10-17 DIAGNOSIS — O24913 Unspecified diabetes mellitus in pregnancy, third trimester: Secondary | ICD-10-CM

## 2014-10-17 DIAGNOSIS — O99210 Obesity complicating pregnancy, unspecified trimester: Secondary | ICD-10-CM | POA: Diagnosis not present

## 2014-10-17 DIAGNOSIS — E669 Obesity, unspecified: Secondary | ICD-10-CM

## 2014-10-17 DIAGNOSIS — O09523 Supervision of elderly multigravida, third trimester: Secondary | ICD-10-CM

## 2014-10-17 NOTE — Progress Notes (Signed)
NST: FHR 130's baseline Mod variability 15x15 accels; no decels Reactive NST Kirby FunkSarah Elvis Boot, MD

## 2014-10-18 ENCOUNTER — Inpatient Hospital Stay
Admission: EM | Admit: 2014-10-18 | Discharge: 2014-10-21 | DRG: 767 | Disposition: A | Discharge status: Home / Self Care | Payer: No Typology Code available for payment source | Attending: Obstetrics and Gynecology | Admitting: Obstetrics and Gynecology

## 2014-10-18 ENCOUNTER — Encounter: Payer: Self-pay | Admitting: *Deleted

## 2014-10-18 DIAGNOSIS — IMO0001 Reserved for inherently not codable concepts without codable children: Secondary | ICD-10-CM

## 2014-10-18 DIAGNOSIS — O24429 Gestational diabetes mellitus in childbirth, unspecified control: Secondary | ICD-10-CM | POA: Diagnosis present

## 2014-10-18 DIAGNOSIS — Z794 Long term (current) use of insulin: Secondary | ICD-10-CM

## 2014-10-18 DIAGNOSIS — O24913 Unspecified diabetes mellitus in pregnancy, third trimester: Secondary | ICD-10-CM

## 2014-10-18 DIAGNOSIS — Z302 Encounter for sterilization: Secondary | ICD-10-CM | POA: Diagnosis not present

## 2014-10-18 DIAGNOSIS — O09523 Supervision of elderly multigravida, third trimester: Secondary | ICD-10-CM | POA: Diagnosis present

## 2014-10-18 DIAGNOSIS — O99213 Obesity complicating pregnancy, third trimester: Secondary | ICD-10-CM

## 2014-10-18 DIAGNOSIS — Z7982 Long term (current) use of aspirin: Secondary | ICD-10-CM

## 2014-10-18 DIAGNOSIS — O34219 Maternal care for unspecified type scar from previous cesarean delivery: Secondary | ICD-10-CM | POA: Diagnosis present

## 2014-10-18 DIAGNOSIS — Z79899 Other long term (current) drug therapy: Secondary | ICD-10-CM

## 2014-10-18 DIAGNOSIS — O99214 Obesity complicating childbirth: Secondary | ICD-10-CM | POA: Diagnosis present

## 2014-10-18 DIAGNOSIS — Z3A39 39 weeks gestation of pregnancy: Secondary | ICD-10-CM | POA: Diagnosis present

## 2014-10-18 HISTORY — DX: Morbid (severe) obesity due to excess calories: E66.01

## 2014-10-18 HISTORY — DX: Supervision of elderly multigravida, unspecified trimester: O09.529

## 2014-10-18 HISTORY — DX: Body Mass Index (BMI) 40.0 and over, adult: Z684

## 2014-10-18 HISTORY — DX: Gestational diabetes mellitus in pregnancy, unspecified control: O24.419

## 2014-10-18 LAB — CBC
HEMATOCRIT: 37.2 % (ref 35.0–47.0)
HEMOGLOBIN: 12.7 g/dL (ref 12.0–16.0)
MCH: 32.2 pg (ref 26.0–34.0)
MCHC: 34.1 g/dL (ref 32.0–36.0)
MCV: 94.4 fL (ref 80.0–100.0)
Platelets: 225 10*3/uL (ref 150–440)
RBC: 3.94 MIL/uL (ref 3.80–5.20)
RDW: 14.7 % — ABNORMAL HIGH (ref 11.5–14.5)
WBC: 10.8 10*3/uL (ref 3.6–11.0)

## 2014-10-18 LAB — TYPE AND SCREEN
ABO/RH(D): O POS
ANTIBODY SCREEN: NEGATIVE

## 2014-10-18 LAB — ABO/RH: ABO/RH(D): O POS

## 2014-10-18 MED ORDER — TERBUTALINE SULFATE 1 MG/ML IJ SOLN: 0.2500 mg | Freq: Once | INTRAMUSCULAR | Status: AC | PRN

## 2014-10-18 MED ORDER — FENTANYL CITRATE (PF) 100 MCG/2ML IJ SOLN
INTRAMUSCULAR | Status: AC
Start: 2014-10-18 — End: 2014-10-18
  Administered 2014-10-18: 50 ug via INTRAVENOUS
  Filled 2014-10-18: qty 2

## 2014-10-18 MED ORDER — FENTANYL CITRATE (PF) 100 MCG/2ML IJ SOLN
50.0000 ug | INTRAMUSCULAR | Status: DC | PRN
  Administered 2014-10-18 (×2): 50 ug via INTRAVENOUS

## 2014-10-18 MED ORDER — LACTATED RINGERS IV SOLN
INTRAVENOUS | Status: DC
  Administered 2014-10-18 – 2014-10-19 (×2): via INTRAVENOUS

## 2014-10-18 MED ORDER — ACETAMINOPHEN 325 MG PO TABS
650.0000 mg | ORAL_TABLET | ORAL | Status: DC | PRN
Start: 2014-10-18 — End: 2014-10-19

## 2014-10-18 NOTE — Progress Notes (Signed)
Intrapartum Progress Note  S: Patient feeling uncomfortable with contractions.  O: Blood pressure 130/61, pulse 66, temperature 98.6 F (37 C), temperature source Oral, resp. rate 18, height 5\' 3"  (1.6 m), weight 98.431 kg (217 lb), last menstrual period 01/15/2014, unknown if currently breastfeeding. Gen App: NAD, comfortable Abdomen: soft, gravid FHT: 140 bpm. Accels present. No decels.  Min-moderate variability noted.  Tocometer: Ctx: 2-3 min Cervix:  6/60/-3/AROM Extremities: Nontender, no edema.  Labs:  Lab Results  Component Value Date   WBC 10.8 10/18/2014   HGB 12.7 10/18/2014   HCT 37.2 10/18/2014   MCV 94.4 10/18/2014   PLT 225 10/18/2014   Assessment:  1: SIUP at 1556w3d 2. TOLAC 3. AROM'd last check with clear fluid  Plan:  1) Continue expectant management.  2. Patient desires IV pain meds. Will give fentanyl.  Hildred LaserAnika Uriah Philipson, MD 10/18/2014 10:17 PM

## 2014-10-18 NOTE — H&P (Signed)
OB History and Physical    HPI: [name] is a 39 y.o. [id number] at 5549w3d, Patient's last menstrual period was 01/15/2014., with EDD of 10/22/2014, by Last Menstrual Period who presents for IOL secondary to GDM Class A2 (on Metformin and insulin).  Patient desired TOLAC again this pregnancy. Has h/o 2 successful VBACs. [facility] to [facility] at ~ [redacted] weeks gestation.  [facility] for high risk pregnancy.   OBHx:  OB History  Gravida Para Term Preterm AB SAB TAB Ectopic Multiple Living  6 3 3  0 2 1    6     # Outcome Date GA Lbr Len/2nd Weight Sex Delivery Anes PTL Lv  6 Current           5 SAB 12/2008 46730w0d         4 Term 08/2007   4.309 kg (9 lb 8 oz) F VBAC  N Y  3 AB 08/2005 3130w0d         2 Term 08/2001 7220w2d  4.423 kg (9 lb 12 oz) F VBAC  N Y  1 Term 11/1998 4512w0d  3.799 kg (8 lb 6 oz) M CS-LTranv  N Y     Complications: Breech presentation      GYNHx: No h/o abnormal paps or STIs.  Last pap normal, 2015.  Marland Kitchen. PMH:  Morbid obesity Advanced maternal age   SurgHx:  Past Surgical History  Procedure Laterality Date  . Cesarean section  11/1998    for breech presentation   Family Hx:  No family history on file.  SocialHx:  History   Social History  . Marital Status: Married    Spouse Name: N/A  . Number of Children: N/A  . Years of Education: N/A   Occupational History  . Not on file.   Social History Main Topics  . Smoking status: Never Smoker   . Smokeless tobacco: Never Used  . Alcohol Use: No  . Drug Use: No  . Sexual Activity: Not on file   Other Topics Concern  . Not on file   Allergies:  No Known Allergies  Medications:  Current Outpatient Prescriptions on File Prior to Encounter  Medication Sig Dispense Refill  . aspirin 81 MG chewable tablet Chew 81 mg by mouth daily.    . insulin glargine (LANTUS) 100 UNIT/ML injection Inject 6 Units into the skin every morning.    . insulin glargine (LANTUS)  100 UNIT/ML injection Inject 4 Units into the skin daily before supper.    . insulin NPH Human (HUMULIN N,NOVOLIN N) 100 UNIT/ML injection 12 Units at bedtime.     . Prenatal Vit-Fe Fumarate-FA (MULTIVITAMIN-PRENATAL) 27-0.8 MG TABS tablet Take 1 tablet by mouth.     ROS:  Negative except for occasional contractions.    Physical Exam:  Blood pressure 120/75, pulse 63, temperature 98.6 F (37 C), temperature source Oral, resp. rate 18, height 5\' 3"  (1.6 m), weight 98.431 kg (217 lb), last menstrual period 01/15/2014, unknown if currently breastfeeding.  Physical Exam  Constitutional: She is oriented to person, place, and time and well-developed, well-nourished, and in no distress.  HENT:  Head: Normocephalic and atraumatic.  Neck: Normal range of motion. Neck supple. No thyromegaly present.  Cardiovascular: Normal rate, regular rhythm and normal heart sounds.  Pulmonary/Chest: Effort normal and breath sounds normal.  Abdominal: Soft. There is no tenderness.  Gravid. FHT: Baseline 145 bpm, accels present, no decels. Good variability. Toco: contractions irregular Genitourinary:  Vagina normal and cervix normal. No vaginal discharge found.  Cervix: 4/60/c/-3/intact Musculoskeletal: Normal range of motion.  Neurological: She is alert and oriented to person, place, and time.  Skin: Skin is warm and dry. No rash noted.  Psychiatric: Affect normal.    Labs:  O+/-/HIV-/ND/NR/VZI/RI/GC-/Cl-/GBS-  CBC Lab Results  Component Value Date   WBC 10.8 10/18/2014   HGB 12.7 10/18/2014   HCT 37.2 10/18/2014   MCV 94.4 10/18/2014   PLT 225 10/18/2014   RGS: abnormal.  (Did not receive records from previous provider).  U/S  10/16/2014: BPP 8/8. Cephalic. AFI 16.2 cm.   10/09/2014: Growth scan EFW 7 lb 0 oz (43%). Cephalic presentation. Placenta left lateral.   :  Assessment:  1. SIUP at 3921w3d 2. H/o C-section x 1 followed by 2 successful VBACs.  Desires TOLAC for current pregnancy.   3. GDM Class A2  4. GBS neg status   Plan:  1. Admit to Labor and Delivery 2. AROM with clear fluid for IOL.  3. Early epidural recommended once active labor ensues. Stadol for IV pain until epidural requested. 4. For GDM, continue Metformin (per [facility]), check BS q 2 hrs.  If BS > 120, will initiate insulin drip protocol.   Hildred LaserAnika Aviah Sorci, MD 10/18/2014 7:15 PM

## 2014-10-19 LAB — CBC
HCT: 33.8 % — ABNORMAL LOW (ref 35.0–47.0)
Hemoglobin: 11.2 g/dL — ABNORMAL LOW (ref 12.0–16.0)
MCH: 31.6 pg (ref 26.0–34.0)
MCHC: 33.3 g/dL (ref 32.0–36.0)
MCV: 94.9 fL (ref 80.0–100.0)
PLATELETS: 199 10*3/uL (ref 150–440)
RBC: 3.56 MIL/uL — ABNORMAL LOW (ref 3.80–5.20)
RDW: 14.8 % — ABNORMAL HIGH (ref 11.5–14.5)
WBC: 19.7 10*3/uL — AB (ref 3.6–11.0)

## 2014-10-19 LAB — RPR: RPR: NONREACTIVE

## 2014-10-19 MED ORDER — IBUPROFEN 800 MG PO TABS
800.0000 mg | ORAL_TABLET | Freq: Three times a day (TID) | ORAL | Status: DC
  Administered 2014-10-20 – 2014-10-21 (×4): 800 mg via ORAL
  Administered 2014-10-21: 600 mg via ORAL
  Filled 2014-10-19 (×4): qty 1

## 2014-10-19 MED ORDER — PRENATAL MULTIVITAMIN CH
1.0000 | ORAL_TABLET | Freq: Every day | ORAL | Status: DC
Start: 2014-10-19 — End: 2014-10-21
  Administered 2014-10-19 – 2014-10-21 (×3): 1 via ORAL
  Filled 2014-10-19 (×2): qty 1

## 2014-10-19 MED ORDER — SIMETHICONE 80 MG PO CHEW: 80.0000 mg | CHEWABLE_TABLET | ORAL | Status: DC | PRN

## 2014-10-19 MED ORDER — DIPHENHYDRAMINE HCL 25 MG PO CAPS: 25.0000 mg | ORAL_CAPSULE | Freq: Four times a day (QID) | ORAL | Status: DC | PRN

## 2014-10-19 MED ORDER — IBUPROFEN 800 MG PO TABS
800.0000 mg | ORAL_TABLET | Freq: Four times a day (QID) | ORAL | Status: DC
Start: 2014-10-19 — End: 2014-10-19
  Administered 2014-10-19 (×2): 800 mg via ORAL
  Filled 2014-10-19 (×2): qty 1

## 2014-10-19 MED ORDER — WITCH HAZEL-GLYCERIN EX PADS: 1.0000 "application " | MEDICATED_PAD | CUTANEOUS | Status: DC | PRN

## 2014-10-19 MED ORDER — FERROUS SULFATE 325 (65 FE) MG PO TABS
325.0000 mg | ORAL_TABLET | Freq: Two times a day (BID) | ORAL | Status: DC
  Administered 2014-10-19 – 2014-10-20 (×4): 325 mg via ORAL
  Filled 2014-10-19 (×4): qty 1

## 2014-10-19 MED ORDER — ONDANSETRON HCL 4 MG PO TABS: 4.0000 mg | ORAL_TABLET | ORAL | Status: DC | PRN

## 2014-10-19 MED ORDER — LACTATED RINGERS IV SOLN
INTRAVENOUS | Status: DC
  Administered 2014-10-19 – 2014-10-20 (×2): via INTRAVENOUS
  Administered 2014-10-20: 1000 mL via INTRAVENOUS

## 2014-10-19 MED ORDER — LIDOCAINE HCL (PF) 1 % IJ SOLN
INTRAMUSCULAR | Status: AC
  Filled 2014-10-19: qty 30

## 2014-10-19 MED ORDER — METOCLOPRAMIDE HCL 10 MG PO TABS: 10.0000 mg | ORAL_TABLET | Freq: Once | ORAL | Status: DC

## 2014-10-19 MED ORDER — MISOPROSTOL 200 MCG PO TABS
ORAL_TABLET | ORAL | Status: AC
  Filled 2014-10-19: qty 4

## 2014-10-19 MED ORDER — DIBUCAINE 1 % RE OINT: 1.0000 "application " | TOPICAL_OINTMENT | RECTAL | Status: DC | PRN

## 2014-10-19 MED ORDER — LANOLIN HYDROUS EX OINT: TOPICAL_OINTMENT | CUTANEOUS | Status: DC | PRN

## 2014-10-19 MED ORDER — ZOLPIDEM TARTRATE 5 MG PO TABS: 5.0000 mg | ORAL_TABLET | Freq: Every evening | ORAL | Status: DC | PRN

## 2014-10-19 MED ORDER — OXYTOCIN 40 UNITS IN LACTATED RINGERS INFUSION - SIMPLE MED
INTRAVENOUS | Status: AC
  Administered 2014-10-19: 04:00:00
  Filled 2014-10-19: qty 1000

## 2014-10-19 MED ORDER — SENNOSIDES-DOCUSATE SODIUM 8.6-50 MG PO TABS
2.0000 | ORAL_TABLET | ORAL | Status: DC
  Administered 2014-10-20 – 2014-10-21 (×2): 2 via ORAL
  Filled 2014-10-19 (×2): qty 2

## 2014-10-19 MED ORDER — SODIUM CHLORIDE 0.9 % IV SOLN: INTRAVENOUS | Status: DC

## 2014-10-19 MED ORDER — FAMOTIDINE 20 MG PO TABS: 40.0000 mg | ORAL_TABLET | Freq: Once | ORAL | Status: DC

## 2014-10-19 MED ORDER — ONDANSETRON HCL 4 MG/2ML IJ SOLN: 4.0000 mg | INTRAMUSCULAR | Status: DC | PRN

## 2014-10-19 MED ORDER — BENZOCAINE-MENTHOL 20-0.5 % EX AERO: 1.0000 "application " | INHALATION_SPRAY | CUTANEOUS | Status: DC | PRN

## 2014-10-20 ENCOUNTER — Inpatient Hospital Stay: Payer: No Typology Code available for payment source | Admitting: Anesthesiology

## 2014-10-20 ENCOUNTER — Encounter
Admission: EM | Disposition: A | Discharge status: Home / Self Care | Payer: Self-pay | Source: Home / Self Care | Attending: Obstetrics and Gynecology

## 2014-10-20 DIAGNOSIS — IMO0001 Reserved for inherently not codable concepts without codable children: Secondary | ICD-10-CM

## 2014-10-20 HISTORY — PX: TUBAL LIGATION: SHX77

## 2014-10-20 LAB — POCT CBG (FASTING - GLUCOSE)-MANUAL ENTRY: GLUCOSE FASTING, POC: 82 mg/dL (ref 70–99)

## 2014-10-20 SURGERY — LIGATION, FALLOPIAN TUBE, POSTPARTUM
Anesthesia: General | Laterality: Bilateral

## 2014-10-20 MED ORDER — ACETAMINOPHEN-CODEINE #3 300-30 MG PO TABS
1.0000 | ORAL_TABLET | Freq: Four times a day (QID) | ORAL | Status: DC | PRN
  Administered 2014-10-20 (×2): 1 via ORAL
  Filled 2014-10-20 (×2): qty 1

## 2014-10-20 MED ORDER — FENTANYL CITRATE (PF) 100 MCG/2ML IJ SOLN
INTRAMUSCULAR | Status: DC | PRN
  Administered 2014-10-20 (×2): 50 ug via INTRAVENOUS

## 2014-10-20 MED ORDER — MIDAZOLAM HCL 2 MG/2ML IJ SOLN
INTRAMUSCULAR | Status: DC | PRN
  Administered 2014-10-20: 2 mg via INTRAVENOUS

## 2014-10-20 MED ORDER — LIDOCAINE HCL (CARDIAC) 20 MG/ML IV SOLN
INTRAVENOUS | Status: DC | PRN
  Administered 2014-10-20: 60 mg via INTRAVENOUS

## 2014-10-20 MED ORDER — ONDANSETRON HCL 4 MG/2ML IJ SOLN: 4.0000 mg | Freq: Once | INTRAMUSCULAR | Status: DC | PRN

## 2014-10-20 MED ORDER — BUPIVACAINE HCL 0.5 % IJ SOLN
INTRAMUSCULAR | Status: DC | PRN
  Administered 2014-10-20: 7 mL

## 2014-10-20 MED ORDER — GLYCOPYRROLATE 0.2 MG/ML IJ SOLN
INTRAMUSCULAR | Status: DC | PRN
  Administered 2014-10-20: .8 mg via INTRAVENOUS

## 2014-10-20 MED ORDER — FENTANYL CITRATE (PF) 100 MCG/2ML IJ SOLN
25.0000 ug | INTRAMUSCULAR | Status: DC | PRN
  Administered 2014-10-20 (×2): 25 ug via INTRAVENOUS

## 2014-10-20 MED ORDER — SUCCINYLCHOLINE CHLORIDE 20 MG/ML IJ SOLN
INTRAMUSCULAR | Status: DC | PRN
  Administered 2014-10-20: 100 mg via INTRAVENOUS

## 2014-10-20 MED ORDER — IBUPROFEN 800 MG PO TABS: 800.0000 mg | ORAL_TABLET | Freq: Three times a day (TID) | ORAL | Status: DC

## 2014-10-20 MED ORDER — NEOSTIGMINE METHYLSULFATE 10 MG/10ML IV SOLN
INTRAVENOUS | Status: DC | PRN
  Administered 2014-10-20: 4.5 mg via INTRAVENOUS

## 2014-10-20 MED ORDER — DOCUSATE SODIUM 100 MG PO CAPS: 100.0000 mg | ORAL_CAPSULE | Freq: Two times a day (BID) | ORAL | Status: DC | PRN

## 2014-10-20 MED ORDER — ONDANSETRON HCL 4 MG/2ML IJ SOLN
INTRAMUSCULAR | Status: DC | PRN
  Administered 2014-10-20: 4 mg via INTRAVENOUS

## 2014-10-20 MED ORDER — FERROUS SULFATE 325 (65 FE) MG PO TABS: 325.0000 mg | ORAL_TABLET | Freq: Two times a day (BID) | ORAL | Status: DC

## 2014-10-20 MED ORDER — PROPOFOL 10 MG/ML IV BOLUS
INTRAVENOUS | Status: DC | PRN
  Administered 2014-10-20: 40 mg via INTRAVENOUS
  Administered 2014-10-20: 150 mg via INTRAVENOUS

## 2014-10-20 MED ORDER — ROCURONIUM BROMIDE 100 MG/10ML IV SOLN
INTRAVENOUS | Status: DC | PRN
  Administered 2014-10-20 (×3): 5 mg via INTRAVENOUS
  Administered 2014-10-20: 20 mg via INTRAVENOUS

## 2014-10-20 SURGICAL SUPPLY — 26 items
BLADE SURG SZ11 CARB STEEL (BLADE) ×3 IMPLANT
CHLORAPREP W/TINT 26ML (MISCELLANEOUS) ×3 IMPLANT
DRAPE LAPAROTOMY 100X77 ABD (DRAPES) ×3 IMPLANT
DRSG TEGADERM 2-3/8X2-3/4 SM (GAUZE/BANDAGES/DRESSINGS) ×3 IMPLANT
GAUZE SPONGE NON-WVN 2X2 STRL (MISCELLANEOUS) ×1 IMPLANT
GLOVE BIO SURGEON STRL SZ 6 (GLOVE) ×9 IMPLANT
GLOVE BIOGEL PI IND STRL 6.5 (GLOVE) ×1 IMPLANT
GLOVE BIOGEL PI INDICATOR 6.5 (GLOVE) ×2
GOWN STRL REUS W/ TWL LRG LVL3 (GOWN DISPOSABLE) ×2 IMPLANT
GOWN STRL REUS W/TWL LRG LVL3 (GOWN DISPOSABLE) ×4
KIT RM TURNOVER CYSTO AR (KITS) ×3 IMPLANT
LABEL OR SOLS (LABEL) ×3 IMPLANT
LIQUID BAND (GAUZE/BANDAGES/DRESSINGS) ×3 IMPLANT
NEEDLE HYPO 25GX1X1/2 BEV (NEEDLE) ×3 IMPLANT
NS IRRIG 500ML POUR BTL (IV SOLUTION) ×3 IMPLANT
PACK BASIN MINOR ARMC (MISCELLANEOUS) ×3 IMPLANT
SPONGE VERSALON 2X2 STRL (MISCELLANEOUS) ×2
SUT MNCRL 4-0 (SUTURE) ×2
SUT MNCRL 4-0 27XMFL (SUTURE) ×1
SUT PLAIN GUT 0 (SUTURE) ×6 IMPLANT
SUT VIC AB 0 CT1 36 (SUTURE) ×3 IMPLANT
SUT VIC AB 0 SH 27 (SUTURE) ×3 IMPLANT
SUT VIC AB 4-0 FS2 27 (SUTURE) ×3 IMPLANT
SUT VICRYL 0 AB UR-6 (SUTURE) ×6 IMPLANT
SUTURE MNCRL 4-0 27XMF (SUTURE) ×1 IMPLANT
SYRINGE 10CC LL (SYRINGE) ×3 IMPLANT

## 2014-10-20 NOTE — Progress Notes (Signed)
UPDATE TO PREVIOUS HISTORY AND PHYSICAL  The patient has been seen and examined.  H&P is up to date, and the following changes are noted:   Patient is postpartum s/p SVD, Day #1. Multiparous, desires permanent sterilization.  Other reversible forms of contraception were previously discussed with patient; she declines all other modalities. Risks of procedure discussed with patient including but not limited to: risk of regret, permanence of method, bleeding, infection, injury to surrounding organs and need for additional procedures.  Failure risk of 1-2 % with increased risk of ectopic gestation if pregnancy occurs was also discussed with patient.  Patient verbalized understanding of these risks and wants to proceed with sterilization.  Written informed consent has been obtained.  To OR when ready.  Hildred LaserAnika Carmichael Burdette, MD 10/20/2014 12:31 PM

## 2014-10-20 NOTE — Progress Notes (Signed)
Post Partum Day #1 Subjective: no complaints, up ad lib, voiding and tolerating PO, but has been NPO since midnight for pending surgery.   Objective: Blood pressure 91/45, pulse 59, temperature 98 F (36.7 C), temperature source Oral, resp. rate 18, height 5\' 3"  (1.6 m), weight 98.431 kg (217 lb), last menstrual period 01/15/2014, SpO2 98 %, unknown if currently breastfeeding.  Physical Exam:  General: alert Lochia: appropriate Uterine Fundus: firm Incision: none DVT Evaluation: No evidence of DVT seen on physical exam.   Recent Labs  10/18/14 1619 10/19/14 0559  HGB 12.7 11.2*  HCT 37.2 33.8*    Assessment/Plan: Plan for discharge tomorrow, Breastfeeding and Contraception postpartum BTL, scheduled for this afternoon.      LOS: 2 days   Jason Frisbee 10/20/2014, 12:26 PM

## 2014-10-20 NOTE — Anesthesia Postprocedure Evaluation (Signed)
  Anesthesia Post-op Note  Patient: Robin PaymentVeronica Guido Morrison  Procedure(s) Performed: Procedure(s): POST PARTUM TUBAL LIGATION (Bilateral)  Anesthesia type:General  Patient location: PACU  Post pain: Pain level controlled  Post assessment: Post-op Vital signs reviewed, Patient's Cardiovascular Status Stable, Respiratory Function Stable, Patent Airway and No signs of Nausea or vomiting  Post vital signs: Reviewed and stable  Last Vitals:  Filed Vitals:   10/20/14 1515  BP: 101/56  Pulse: 64  Temp:   Resp: 17    Level of consciousness: awake, alert  and patient cooperative  Complications: No apparent anesthesia complications

## 2014-10-20 NOTE — Transfer of Care (Signed)
Immediate Anesthesia Transfer of Care Note  Patient: Robin Morrison  Procedure(s) Performed: Procedure(s): POST PARTUM TUBAL LIGATION (Bilateral)  Patient Location: PACU  Anesthesia Type:General  Level of Consciousness: awake and patient cooperative  Airway & Oxygen Therapy: Patient Spontanous Breathing and Patient connected to nasal cannula oxygen  Post-op Assessment: Report given to RN and Post -op Vital signs reviewed and stable  Post vital signs: Reviewed and stable  Last Vitals:  Filed Vitals:   10/20/14 1332  BP: 99/52  Pulse: 63  Temp: 36.6 C  Resp: 18    Complications: No apparent anesthesia complications

## 2014-10-20 NOTE — Anesthesia Preprocedure Evaluation (Signed)
Anesthesia Evaluation  Patient identified by MRN, date of birth, ID band Patient awake    Reviewed: Allergy & Precautions, NPO status , Patient's Chart, lab work & pertinent test results  Airway Mallampati: III  TM Distance: >3 FB Neck ROM: Full    Dental  (+) Chipped   Pulmonary neg pulmonary ROS,          Cardiovascular negative cardio ROS      Neuro/Psych negative neurological ROS  negative psych ROS   GI/Hepatic negative GI ROS, Neg liver ROS,   Endo/Other  negative endocrine ROSdiabetes, Well Controlled, Gestational, Oral Hypoglycemic AgentsMorbid obesity  Renal/GU negative Renal ROS  negative genitourinary   Musculoskeletal negative musculoskeletal ROS (+)   Abdominal   Peds negative pediatric ROS (+)  Hematology negative hematology ROS (+)   Anesthesia Other Findings   Reproductive/Obstetrics negative OB ROS (+) Pregnancy                             Anesthesia Physical Anesthesia Plan  ASA: III  Anesthesia Plan: General   Post-op Pain Management:    Induction: Intravenous  Airway Management Planned: Oral ETT  Additional Equipment:   Intra-op Plan:   Post-operative Plan:   Informed Consent: I have reviewed the patients History and Physical, chart, labs and discussed the procedure including the risks, benefits and alternatives for the proposed anesthesia with the patient or authorized representative who has indicated his/her understanding and acceptance.     Plan Discussed with:   Anesthesia Plan Comments:         Anesthesia Quick Evaluation

## 2014-10-20 NOTE — Discharge Instructions (Signed)
Call your doctor for increased pain or vaginal bleeding, temperature above 100.4, depression, or concerns.  No strenuous activity or heavy lifting for 6 weeks.  No intercourse, tampons, douching, or enemas for 6 weeks.  No tub baths-showers only.  No driving for 2 weeks or while taking pain medications.  Continue prenatal vitamin and iron.  Keep incision clean and dry.  Call your doctor for incision concerns including redness, swelling, bleeding or drainage, or if begins to come apart.

## 2014-10-20 NOTE — Op Note (Signed)
Robin Morrison 10/18/2014 - 10/20/2014  PREOPERATIVE DIAGNOSES: Multiparity, obesity, undesired fertility  POSTOPERATIVE DIAGNOSES: Multiparity, obesity, undesired fertility  PROCEDURE:  Postpartum Bilateral Tubal Sterilization using Pomeroy method   SURGEON: Dr.  Hildred LaserAnika Seferina Morrison  ANESTHESIA:  General  COMPLICATIONS:  None.  ESTIMATED BLOOD LOSS:  minimal.  FLUIDS: 700 ml LR.  URINE OUTPUT: voided prior to procedure.  INDICATIONS:  39 y.o. [id number] with undesired fertility,status post vaginal delivery, desires permanent sterilization.  Other reversible forms of contraception were discussed with patient; she declines all other modalities. Risks of procedure discussed with patient including but not limited to: risk of regret, permanence of method, bleeding, infection, injury to surrounding organs and need for additional procedures.  Failure risk of 1 -2 % with increased risk of ectopic gestation if pregnancy occurs was also discussed with patient.      FINDINGS:  Normal uterus, tubes, and ovaries.  Fundus 1 cm above umbilicus.   PROCEDURE DETAILS: The patient was taken to the operating room where her epidural anesthesia was dosed up to surgical level and found to be adequate.  She was then placed in the dorsal supine position and prepped and draped in sterile fashion.  After an adequate timeout was performed, attention was turned to the patient's abdomen where a small transverse skin incision was made under the umbilical fold. The incision was then injected with 7 ml of 1% Lidocaine. The incision was taken down to the layer of fascia using the scalpel, and fascia was incised, and extended bilaterally using Mayo scissors. The peritoneum was entered in a sharp fashion.  The separated edges of the fascia were then tagged with a stitch of 0-Vicryl. Attention was then turned to the patient's uterus, and left fallopian tube was identified and followed out to the fimbriated end.  The Babcock clamp was  then used to grasp the tube approximately 4 cm from the cornual region. A 3 cm segment of tube was then ligated with two free plain gut sutures using the Pomeroy method and excised, with care given to incorporate the underlying mesosalpinx. Good hemostasis was noted and the tube was returned to the abdomen.  A similar process was carried out on the right side allowing for bilateral tubal sterilization.  Good hemostasis was noted overall. The instruments were then removed from the patient's abdomen and the fascial incision was repaired with 0 Vicryl.  The subcutaneous fat tissue layer was approximated with 2-0 Vicryl. The skin was closed with a 4-0 Vicryl subcuticular stitch. Liquiband was placed over the incision.The patient tolerated the procedure well.  Instrument, sponge, and needle counts were correct times two.  The patient was then taken to the recovery room awake and in stable condition.   Hildred LaserAnika Meshilem Machuca, MD Obstetrics and Gynecology [facility] [phone] (pager)

## 2014-10-20 NOTE — Progress Notes (Signed)
Pt transferred back to mother/baby via bed by PACU RN at 1618 on 10/20/14. Reynold BowenSusan Paisley Conya Ellinwood, RN 10/20/2014 6:57 PM

## 2014-10-21 ENCOUNTER — Ambulatory Visit: Payer: No Typology Code available for payment source

## 2014-10-21 ENCOUNTER — Encounter: Payer: Self-pay | Admitting: Obstetrics and Gynecology

## 2014-10-21 LAB — GLUCOSE, CAPILLARY: GLUCOSE-CAPILLARY: 82 mg/dL (ref 65–99)

## 2014-10-21 MED ORDER — IBUPROFEN 800 MG PO TABS: 800.0000 mg | ORAL_TABLET | Freq: Three times a day (TID) | ORAL | Status: DC

## 2014-10-21 MED ORDER — ACETAMINOPHEN-CODEINE #3 300-30 MG PO TABS: 1.0000 | ORAL_TABLET | Freq: Four times a day (QID) | ORAL | Status: DC | PRN

## 2014-10-21 NOTE — Discharge Summary (Signed)
Obstetric Discharge Summary Reason for Admission: induction of labor and GDM Class A2 Prenatal Procedures: none Intrapartum Procedures: spontaneous vaginal delivery Postpartum Procedures: P.P. tubal ligation Complications-Operative and Postpartum: none   CBC Latest Ref Rng 10/19/2014 10/18/2014 03/07/2014  WBC 3.6 - 11.0 K/uL 19.7(H) 10.8 11.3(H)  Hemoglobin 12.0 - 16.0 g/dL 11.2(L) 12.7 12.5  Hematocrit 35.0 - 47.0 % 33.8(L) 37.2 37.0  Platelets 150 - 440 K/uL 199 225 277    Physical Exam:  General: alert, cooperative and no distress Lochia: appropriate Uterine Fundus: firm Incision: healing well DVT Evaluation: No evidence of DVT seen on physical exam.  Discharge Diagnoses: Term Pregnancy-delivered  Discharge Information: Date: 10/21/2014 Activity: pelvic rest x 6 weeks Diet: routine Medications: PNV, Tylenol #3 and Ibuprofen Condition: stable Instructions: refer to practice specific booklet Discharge to: home Follow-up Information    Follow up with Hildred LaserHERRY, Demarques Pilz, MD In 6 weeks.   Specialty:  Obstetrics and Gynecology   Why:  for postpartum care   Contact information:   [address] [place] [address] [phone]       Newborn Data: Live born female  Birth Weight: 7 lb 14 oz (3572 g) APGAR: 8,9   Home with mother.  Robin Morrison 10/21/2014, 11:05 AM

## 2014-10-21 NOTE — Progress Notes (Signed)
Reviewed d/c instructions and prescriptions with patient via Interpreter Jacqui and answered any questions.  Patient d/c home with infant via wheelchair by auxillary

## 2014-10-24 LAB — SURGICAL PATHOLOGY

## 2014-11-28 ENCOUNTER — Ambulatory Visit (INDEPENDENT_AMBULATORY_CARE_PROVIDER_SITE_OTHER): Payer: No Typology Code available for payment source | Admitting: Obstetrics and Gynecology

## 2014-11-28 ENCOUNTER — Encounter: Payer: Self-pay | Admitting: Obstetrics and Gynecology

## 2014-11-28 VITALS — BP 99/66 | HR 62 | Ht 63.0 in | Wt 203.1 lb

## 2014-11-28 DIAGNOSIS — O24414 Gestational diabetes mellitus in pregnancy, insulin controlled: Secondary | ICD-10-CM

## 2014-11-29 LAB — GLUCOSE TOLERANCE, 2 HOURS
Glucose, 2 hour: 134 mg/dL (ref 65–139)
Glucose, GTT - Fasting: 89 mg/dL (ref 65–99)

## 2014-11-29 LAB — HEMOGLOBIN

## 2014-12-03 NOTE — Progress Notes (Signed)
Subjective:     Robin BilesVeronica Guido De Morrison is a 39 y.o. female who presents for a postpartum visit. She is 6 weeks postpartum following a spontaneous vaginal delivery (VBAC). I have fully reviewed the prenatal and intrapartum course. Pregnancy was complicated by gestational diabetes (Class A2) The delivery was at 2339 gestational weeks.  Anesthesia: epidural. Postpartum course has been well. Baby's course has been well. Baby is feeding by bottle (previously by breast). Bleeding no bleeding. Bowel function is normal. Bladder function is normal. Patient is not sexually active. Contraception method is tubal ligation during postpartum course. Postpartum depression screening: negative.  Has been taking PNV as prescribed.  Has resumed menses. LMP 11/20/2014.    The following portions of the patient's history were reviewed and updated as appropriate: allergies, current medications, past family history, past medical history, past social history, past surgical history and problem list.  Review of Systems A comprehensive review of systems was negative.   Objective:    BP 99/66 mmHg  Pulse 62  Ht 5\' 3"  (1.6 m)  Wt 203 lb 1.6 oz (92.126 kg)  BMI 35.99 kg/m2  Breastfeeding? No  General:  alert and no distress   Breasts:  inspection negative, no nipple discharge or bleeding, no masses or nodularity palpable  Lungs: clear to auscultation bilaterally  Heart:  regular rate and rhythm, S1, S2 normal, no murmur, click, rub or gallop  Abdomen: soft, non-tender; bowel sounds normal; no masses,  no organomegaly   Vulva:  normal  Vagina: normal vagina, no discharge, exudate, lesion, or erythema  Cervix:  normal appearing  Corpus: normal size, contour, position, consistency, mobility, non-tender  Adnexa:  normal adnexa and no mass, fullness, tenderness  Rectal Exam: Not performed.         Labs:  Lab Results  Component Value Date   HGB 11.2* 10/19/2014    Assessment:     Normal postpartum exam. Pap smear not  done at today's visit.   Plan:    1. Contraception: tubal ligation 2. Desires to return to work.  Needs work notice.  3. 2 hr glucola done today to screen for diabetes 4. Can resume sexual and all other activity as desired.  5. Follow up in: 3 months for annual exam.     Hildred LaserAnika Claudina Oliphant, MD [facility]

## 2014-12-06 ENCOUNTER — Telehealth: Payer: Self-pay

## 2014-12-06 NOTE — Telephone Encounter (Signed)
-----   Message from Robin LaserAnika Cherry, MD sent at 11/30/2014  2:27 PM EDT ----- Please inform patient of normal 2 hr glucola.  Patient's gestational diabetes has resolved.

## 2014-12-06 NOTE — Telephone Encounter (Signed)
Pt gave daughter the phone because she does not speak good english and was made aware gestional diabetes has resolved.

## 2015-05-08 ENCOUNTER — Encounter: Payer: Self-pay | Admitting: Obstetrics and Gynecology

## 2015-05-08 ENCOUNTER — Ambulatory Visit (INDEPENDENT_AMBULATORY_CARE_PROVIDER_SITE_OTHER): Payer: No Typology Code available for payment source | Admitting: Obstetrics and Gynecology

## 2015-05-08 VITALS — BP 100/69 | HR 71 | Ht 63.0 in | Wt 230.0 lb

## 2015-05-08 DIAGNOSIS — E119 Type 2 diabetes mellitus without complications: Secondary | ICD-10-CM | POA: Insufficient documentation

## 2015-05-08 DIAGNOSIS — B351 Tinea unguium: Secondary | ICD-10-CM

## 2015-05-08 DIAGNOSIS — B372 Candidiasis of skin and nail: Secondary | ICD-10-CM

## 2015-05-08 DIAGNOSIS — B373 Candidiasis of vulva and vagina: Secondary | ICD-10-CM

## 2015-05-08 DIAGNOSIS — E11620 Type 2 diabetes mellitus with diabetic dermatitis: Secondary | ICD-10-CM | POA: Diagnosis not present

## 2015-05-08 DIAGNOSIS — E1169 Type 2 diabetes mellitus with other specified complication: Secondary | ICD-10-CM

## 2015-05-08 DIAGNOSIS — B3731 Acute candidiasis of vulva and vagina: Secondary | ICD-10-CM | POA: Insufficient documentation

## 2015-05-08 MED ORDER — NYSTATIN-TRIAMCINOLONE 100000-0.1 UNIT/GM-% EX OINT: 1.0000 "application " | TOPICAL_OINTMENT | Freq: Two times a day (BID) | CUTANEOUS | Status: AC

## 2015-05-08 NOTE — Progress Notes (Signed)
Patient ID: Robin Morrison, female   DOB: 08/27/1975, 39 y.o.   MRN: [id number]  Rash on groin- bilateral- itch  wants bs check- had gdm- 2 hour pp wnl  checking bs at home has been elevated- 217 -highest  ? Toenail fungus-  Chief complaint: 1.  Vulvar rash. 2.  Toe fungus. 3.  History of gestational diabetes, 1032.  39 year old Hispanic female, para [id number], using BTL for contraception, with most recent delivery complicated by GDM A2, presents for evaluation of vulvar rash and toe fungus.  Since her delivery.  2.  Her postprandial glucose test was normal.  Checking blood sugars at home, has been notable for fasting blood sugars, as well as 2 hour postprandial blood sugars being in the 100-200 range (217-highest). She has been experiencing this off again on again itching of the groin over the past 2 months.  She also complains of toe fungus, chronic.  Past Medical History  Diagnosis Date  . Morbid obesity with BMI of 40.0-44.9, adult (HCC)   . Gestational diabetes mellitus, class A2 05/2014  . Advanced maternal age in multigravida    Past Surgical History  Procedure Laterality Date  . Cesarean section  11/1998    for breech presentation  . Tubal ligation Bilateral 10/20/2014    Procedure: POST PARTUM TUBAL LIGATION;  Surgeon: Hildred LaserAnika Cherry, MD;  Location: [facility] ORS;  Service: Gynecology;  Laterality: Bilateral;    Review of Systems  Constitutional: Negative.   HENT: Negative.   Respiratory: Negative.   Cardiovascular: Negative.   Gastrointestinal: Negative.   Genitourinary: Negative.   Musculoskeletal: Negative.   Skin: Positive for itching and rash.       Groin in the groin region   OBJECTIVE: BP 100/69 mmHg  Pulse 71  Ht 5\' 3"  (1.6 m)  Wt 230 lb (104.327 kg)  BMI 40.75 kg/m2  LMP 04/12/2015 (Exact Date)  Breastfeeding? No Pleasant female in no acute distress. Abdomen: Soft, nontender, without organomegaly. Pelvic exam: External genitalia-chronic skin discoloration,  increased pigmentation noted in the groin and mons region. BUS-normal Vagina-normal estrogen effect; thin white secretions present. Cervix-no lesions. Uterus-not examined. Adnexa-not examined. Rectal exam-no external lesions. Extremities: Onychomycosis noted in tonails  ASSESSMENT: 1.  Monilia vulvitis. 2.  Onychomycosis. 3.  Suspected adult-onset diabetes mellitus with evidence of poor blood sugar control following pregnancy complicated by GDM A2.  PLAN: 1.  Mycolog ointment topically twice a day for 30 days. 2.  Return in 4 weeks for recheck. 3.  Referral to podiatry for onychomycosis. 4.  Referral to primary care for diabetes mellitus evaluation and management.  Herold HarmsMartin A Kissy Cielo, MD  Note: This dictation was prepared with Dragon dictation along with smaller phrase technology. Any transcriptional errors that result from this process are unintentional.

## 2015-05-08 NOTE — Patient Instructions (Signed)
1.  Apply Mycolog ointment twice a day for 30 days. 2.  Return in 4 weeks for recheck. 3.  Refer to podiatry for onychomycosis. 5.  Refer to primary care for uncontrolled blood sugars.  Patient has history of gestational diabetes mellitus, insulin requiring.

## 2015-05-15 ENCOUNTER — Encounter: Payer: No Typology Code available for payment source | Admitting: Obstetrics and Gynecology

## 2015-06-05 ENCOUNTER — Encounter: Payer: Self-pay | Admitting: Obstetrics and Gynecology

## 2015-06-05 ENCOUNTER — Ambulatory Visit (INDEPENDENT_AMBULATORY_CARE_PROVIDER_SITE_OTHER): Payer: BLUE CROSS/BLUE SHIELD | Admitting: Obstetrics and Gynecology

## 2015-06-05 VITALS — BP 109/75 | HR 76 | Ht 63.0 in | Wt 234.9 lb

## 2015-06-05 DIAGNOSIS — B373 Candidiasis of vulva and vagina: Secondary | ICD-10-CM

## 2015-06-05 DIAGNOSIS — B372 Candidiasis of skin and nail: Secondary | ICD-10-CM

## 2015-06-05 DIAGNOSIS — E11620 Type 2 diabetes mellitus with diabetic dermatitis: Secondary | ICD-10-CM

## 2015-06-05 DIAGNOSIS — B3731 Acute candidiasis of vulva and vagina: Secondary | ICD-10-CM

## 2015-06-05 NOTE — Progress Notes (Signed)
Chief complaint: 1.  Chronic vulvitis, monilia  Patient presents for follow-up on chronic vulvar monilia infection.  She is now 30 days status post nystatin/triamcinolone cream therapy being applied topically twice a day for 30 days.  After several days of therapy, itching, resolved.  She is not experiencing any irritative vulvar symptoms at this time.  She does not have any vaginal discharge.  Blood sugars are up and down (suboptimally controlled).  Past medical history, past surgical history, problem list, medications, and allergies are reviewed.  Review of systems: Per HPI.  OBJECTIVE: BP 109/75 mmHg  Pulse 76  Ht 5\' 3"  (1.6 m)  Wt 234 lb 14.4 oz (106.55 kg)  BMI 41.62 kg/m2  LMP 05/22/2015  Breastfeeding? No Pleasant, well-appearing Hispanic female in no acute distress.  She is alert and oriented.  Hospital interpreter is  Present for interview assistance. Abdomen: Soft, nontender. Pelvic exam: External genitalia-chronic skin discoloration, increased pigmentation noted in the groin and mons region.No erythema or epithelial skin breakdown BUS-normal Vagina-normal estrogen effect; thin white secretions present. Cervix-no lesions. Uterus-not examined. Adnexa-not examined. Rectal exam-no external lesions  ASSESSMENT: 1. 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 2.  History of diabetes mellitus with suboptimal control.  PLAN: 1.  Discontinue nystatin/triamcinolone cream. 2.  Monitor for recurrence of symptoms; patient may restart medication.  If she develops chronic vulvar itching.  She is encouraged not to wait a prolonged period of time prior to initiating treatment or coming in for evaluation. 3.  Strongly encouraged Maintenance of normal glucose levels.  She does understand that Uncontrolled  diabetes mellitus Will likely Contributing to recurrent infections.  Herold HarmsMartin A Zari Cly, MD  Note: This dictation was prepared with Dragon dictation along with smaller phrase  technology. Any transcriptional errors that result from this process are unintentional.

## 2015-06-05 NOTE — Patient Instructions (Signed)
1.  Discontinue the nystatin triamcinolone medication at this time.. 2.  Try to maintain excellent glucose control with diet and exercise as well as medication. 3.  Return as needed  If symptoms recur.

## 2017-08-04 ENCOUNTER — Other Ambulatory Visit: Payer: Self-pay | Admitting: Gastroenterology

## 2017-08-04 DIAGNOSIS — R7989 Other specified abnormal findings of blood chemistry: Secondary | ICD-10-CM

## 2017-08-04 DIAGNOSIS — R110 Nausea: Secondary | ICD-10-CM

## 2017-08-04 DIAGNOSIS — R1013 Epigastric pain: Secondary | ICD-10-CM

## 2017-08-04 DIAGNOSIS — R945 Abnormal results of liver function studies: Secondary | ICD-10-CM

## 2017-08-23 ENCOUNTER — Ambulatory Visit
Admission: RE | Admit: 2017-08-23 | Discharge: 2017-08-23 | Disposition: A | Discharge status: Home / Self Care | Payer: BLUE CROSS/BLUE SHIELD | Source: Ambulatory Visit | Attending: Gastroenterology | Admitting: Gastroenterology

## 2017-08-23 DIAGNOSIS — R945 Abnormal results of liver function studies: Secondary | ICD-10-CM | POA: Diagnosis not present

## 2017-08-23 DIAGNOSIS — R110 Nausea: Secondary | ICD-10-CM | POA: Insufficient documentation

## 2017-08-23 DIAGNOSIS — R1012 Left upper quadrant pain: Secondary | ICD-10-CM | POA: Insufficient documentation

## 2017-08-23 DIAGNOSIS — R1013 Epigastric pain: Secondary | ICD-10-CM | POA: Diagnosis present

## 2017-08-23 DIAGNOSIS — K760 Fatty (change of) liver, not elsewhere classified: Secondary | ICD-10-CM | POA: Diagnosis not present

## 2017-08-23 DIAGNOSIS — R7989 Other specified abnormal findings of blood chemistry: Secondary | ICD-10-CM

## 2017-09-07 ENCOUNTER — Other Ambulatory Visit: Payer: Self-pay | Admitting: Family Medicine

## 2017-09-07 DIAGNOSIS — R1031 Right lower quadrant pain: Secondary | ICD-10-CM

## 2017-09-14 ENCOUNTER — Ambulatory Visit
Admission: RE | Admit: 2017-09-14 | Discharge: 2017-09-14 | Disposition: A | Discharge status: Home / Self Care | Payer: BLUE CROSS/BLUE SHIELD | Source: Ambulatory Visit | Attending: Family Medicine | Admitting: Family Medicine

## 2017-09-14 DIAGNOSIS — R1031 Right lower quadrant pain: Secondary | ICD-10-CM

## 2017-12-08 ENCOUNTER — Other Ambulatory Visit: Payer: Self-pay | Admitting: Obstetrics & Gynecology

## 2017-12-08 DIAGNOSIS — I868 Varicose veins of other specified sites: Secondary | ICD-10-CM

## 2017-12-08 DIAGNOSIS — R102 Pelvic and perineal pain: Secondary | ICD-10-CM

## 2017-12-08 DIAGNOSIS — N9089 Other specified noninflammatory disorders of vulva and perineum: Secondary | ICD-10-CM

## 2017-12-09 ENCOUNTER — Ambulatory Visit
Admission: RE | Admit: 2017-12-09 | Discharge: 2017-12-09 | Disposition: A | Discharge status: Home / Self Care | Payer: BLUE CROSS/BLUE SHIELD | Source: Ambulatory Visit | Attending: Obstetrics & Gynecology | Admitting: Obstetrics & Gynecology

## 2017-12-09 DIAGNOSIS — R102 Pelvic and perineal pain: Secondary | ICD-10-CM

## 2017-12-09 DIAGNOSIS — N9089 Other specified noninflammatory disorders of vulva and perineum: Secondary | ICD-10-CM

## 2017-12-09 DIAGNOSIS — I868 Varicose veins of other specified sites: Secondary | ICD-10-CM

## 2017-12-09 MED ORDER — IOPAMIDOL (ISOVUE-300) INJECTION 61%
125.0000 mL | Freq: Once | INTRAVENOUS | Status: AC | PRN
  Administered 2017-12-09: 125 mL via INTRAVENOUS

## 2019-09-27 ENCOUNTER — Other Ambulatory Visit: Payer: Self-pay | Admitting: Obstetrics and Gynecology

## 2019-09-27 DIAGNOSIS — N644 Mastodynia: Secondary | ICD-10-CM

## 2019-09-28 IMAGING — US US ABDOMEN LIMITED
1 series · 14 of 25 positions shown · non-contrast
Comparison: None.

CLINICAL DATA: Epigastric pain with elevated LFTs

EXAM:
ULTRASOUND ABDOMEN LIMITED RIGHT UPPER QUADRANT

[Series 1: us abdomen limited · 14 of 38 slices shown]
[im 1/38]
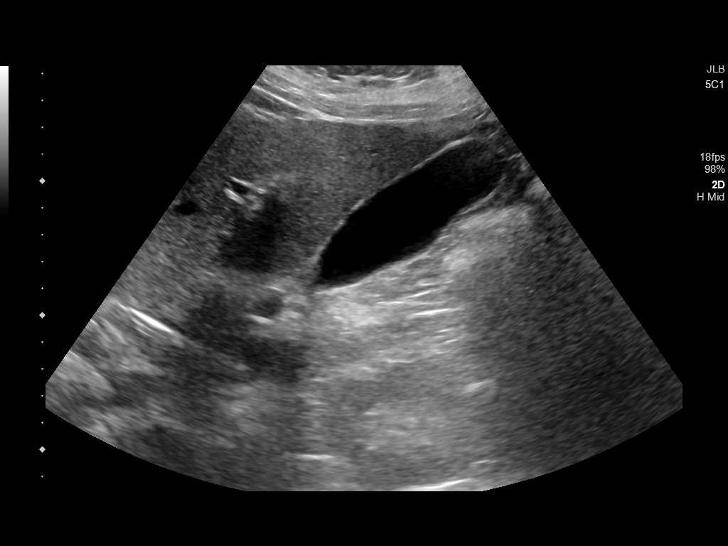
[im 4/38]
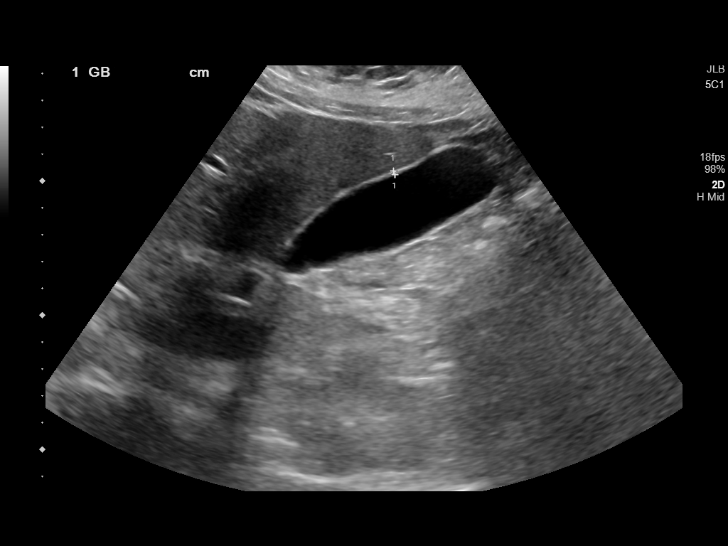
[im 7/38]
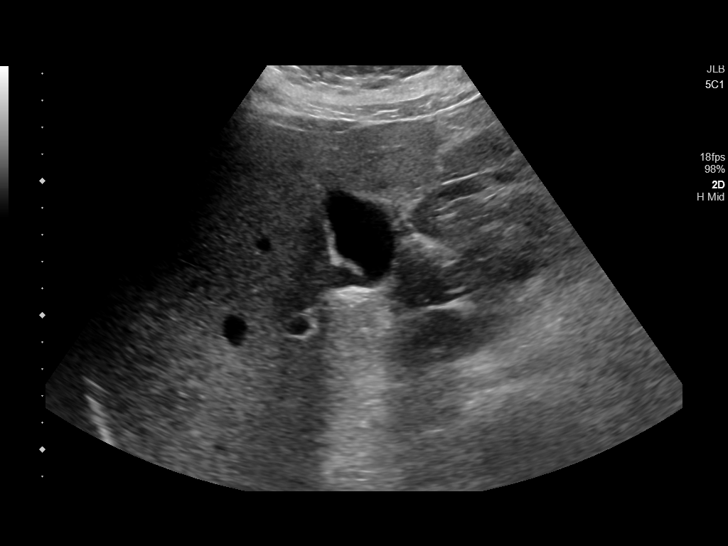
[im 10/38]
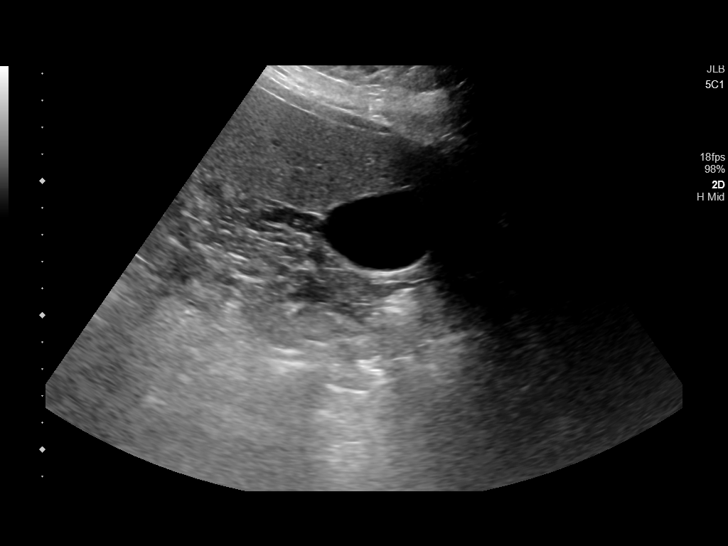
[im 13/38]
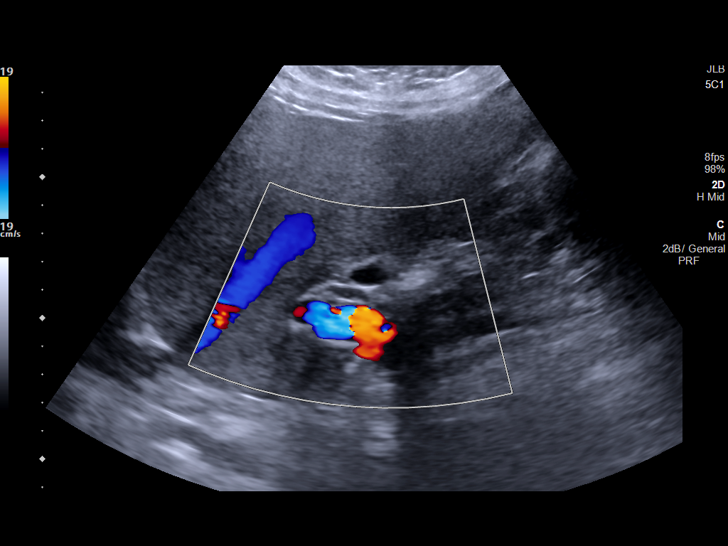
[im 14/38]
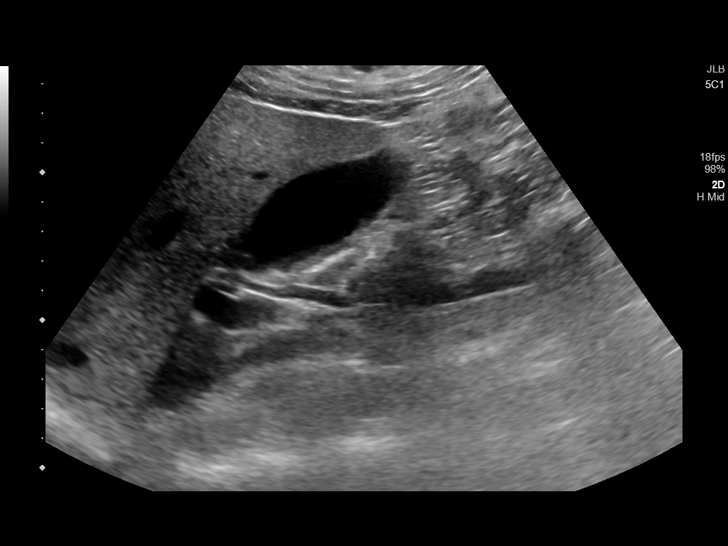
[im 17/38]
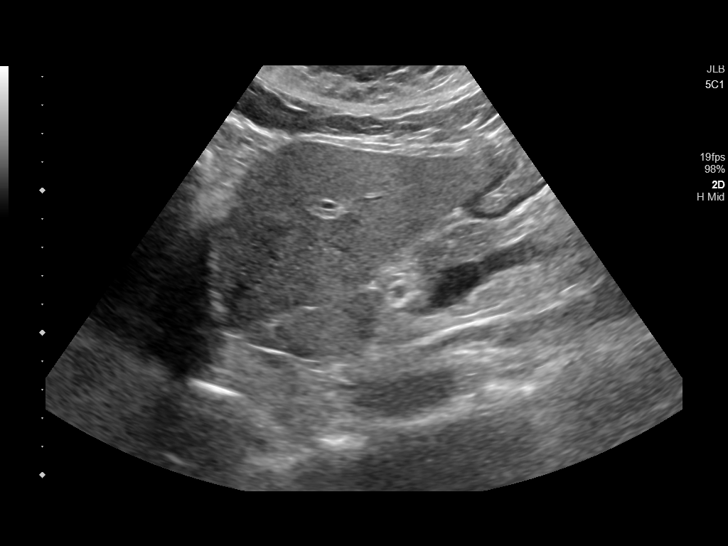
[im 21/38]
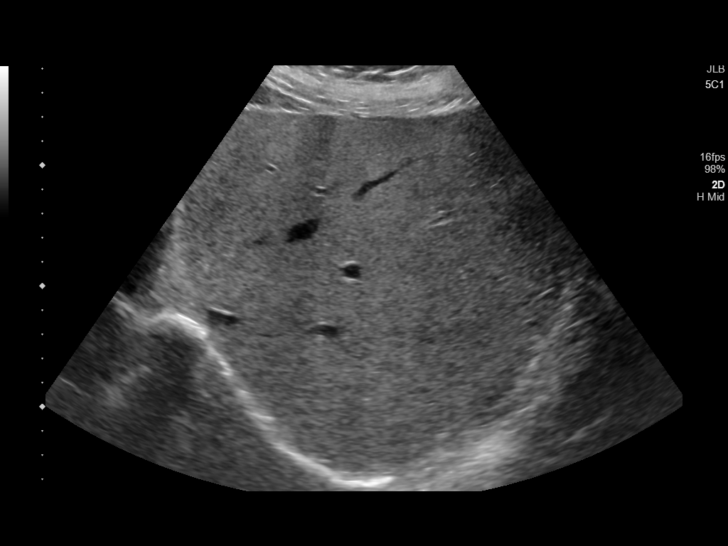
[im 24/38]
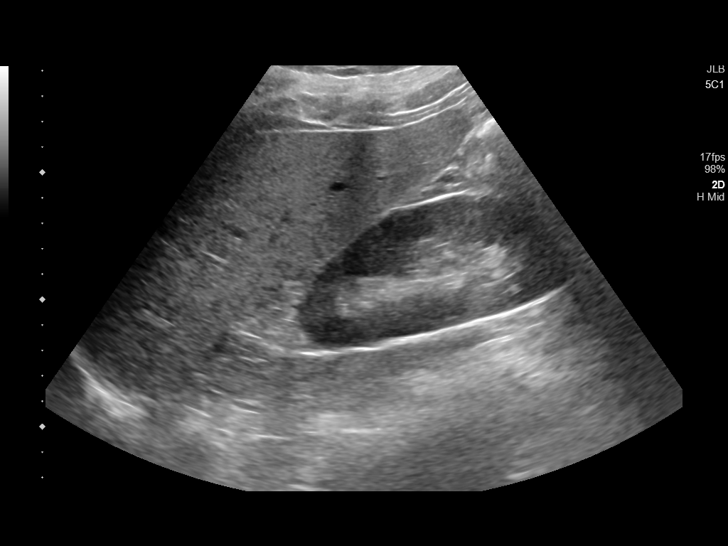
[im 25/38]
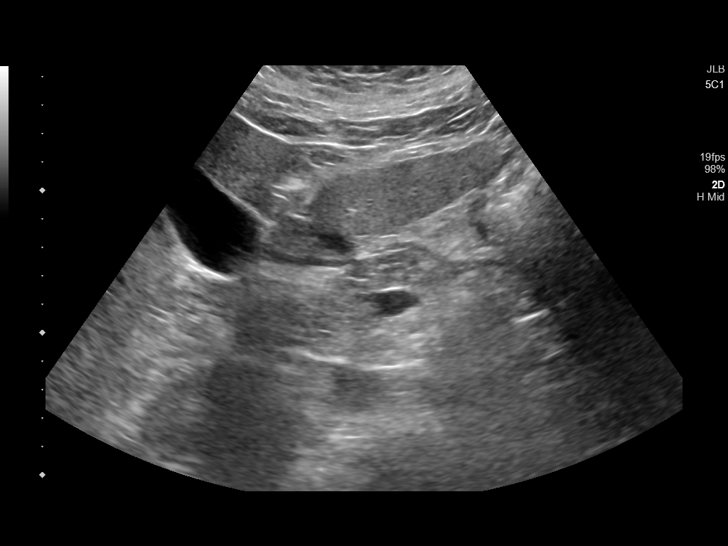
[im 28/38]
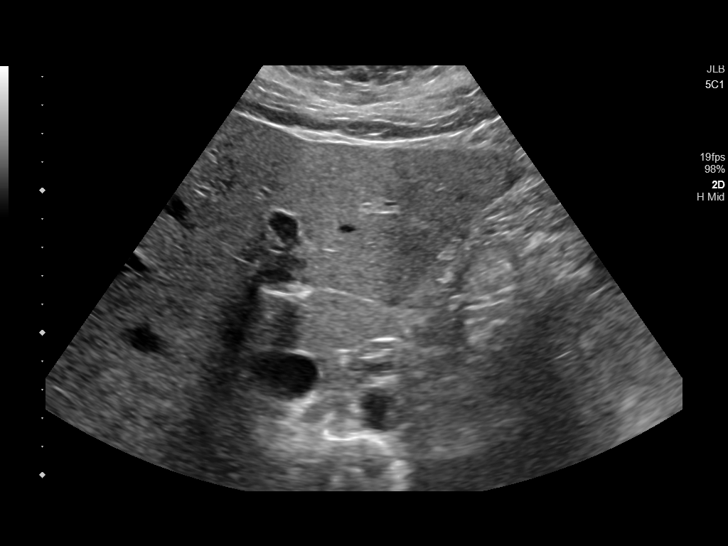
[im 31/38]
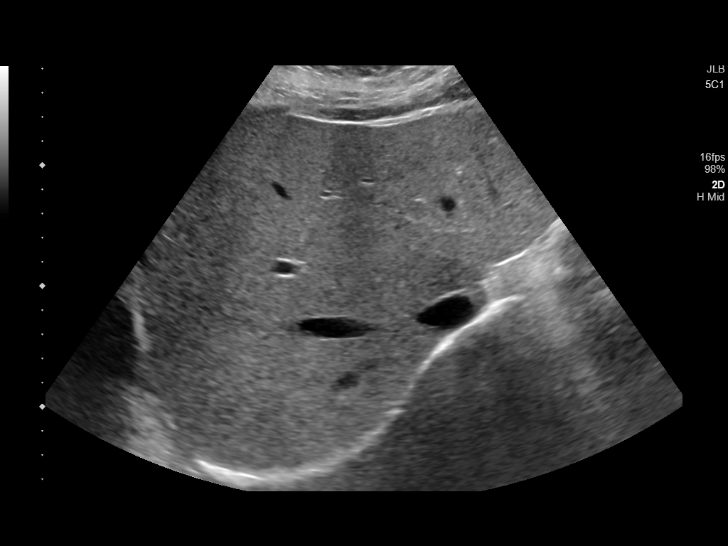
[im 34/38]
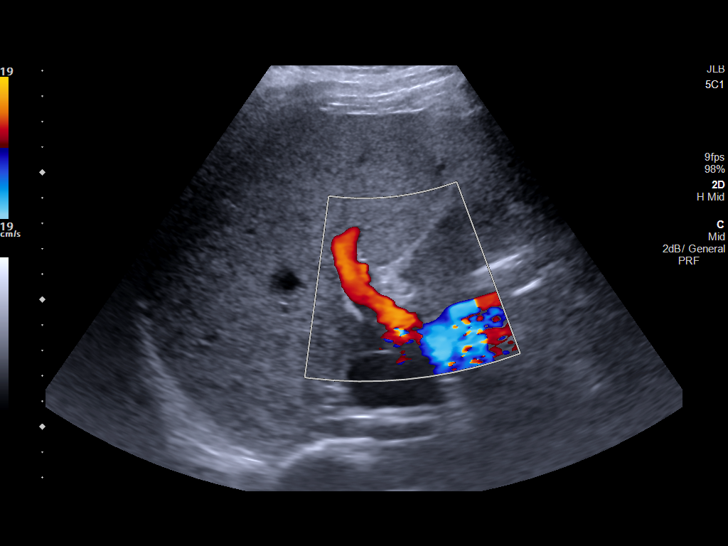
[im 38/38]
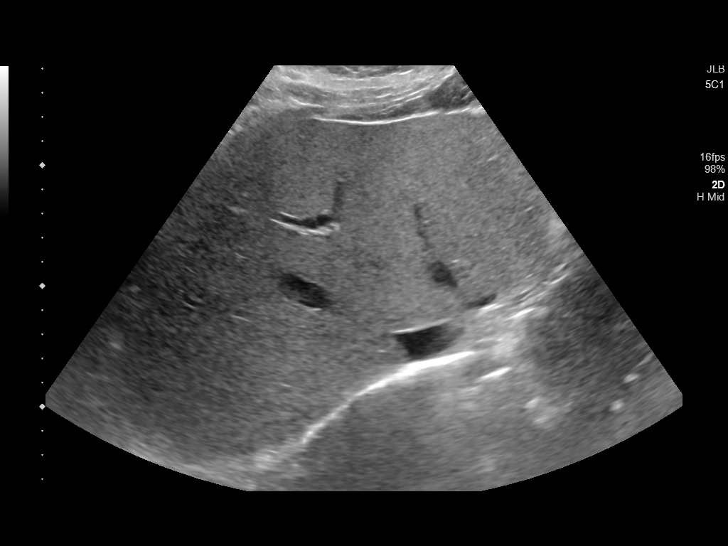

[14 of 25 positions shown; findings below may reference images not displayed]

FINDINGS: Gallbladder:

No gallstones or wall thickening visualized. No sonographic Murphy
sign noted by sonographer.

Common bile duct:

Diameter: 3.4 mm.

Liver:

Mild increased echogenicity is noted consistent with fatty
infiltration. No focal mass is noted. Portal vein is patent on color
Doppler imaging with normal direction of blood flow towards the
liver.
IMPRESSION: Fatty liver.

No other focal abnormality is noted.

## 2019-10-09 ENCOUNTER — Ambulatory Visit
Admission: RE | Admit: 2019-10-09 | Discharge: 2019-10-09 | Disposition: A | Discharge status: Home / Self Care | Payer: BLUE CROSS/BLUE SHIELD | Source: Ambulatory Visit | Attending: Obstetrics and Gynecology | Admitting: Obstetrics and Gynecology

## 2019-10-09 DIAGNOSIS — N644 Mastodynia: Secondary | ICD-10-CM

## 2021-10-12 ENCOUNTER — Other Ambulatory Visit: Payer: Self-pay | Admitting: Obstetrics and Gynecology

## 2021-10-12 DIAGNOSIS — Z1231 Encounter for screening mammogram for malignant neoplasm of breast: Secondary | ICD-10-CM

## 2021-11-16 ENCOUNTER — Ambulatory Visit
Admission: RE | Admit: 2021-11-16 | Discharge: 2021-11-16 | Disposition: A | Discharge status: Home / Self Care | Payer: BLUE CROSS/BLUE SHIELD | Source: Ambulatory Visit | Attending: Obstetrics and Gynecology | Admitting: Obstetrics and Gynecology

## 2021-11-16 DIAGNOSIS — Z1231 Encounter for screening mammogram for malignant neoplasm of breast: Secondary | ICD-10-CM | POA: Insufficient documentation

## 2022-09-16 ENCOUNTER — Encounter: Payer: Self-pay | Admitting: *Deleted

## 2022-09-17 ENCOUNTER — Ambulatory Visit: Payer: BLUE CROSS/BLUE SHIELD | Admitting: Anesthesiology

## 2022-09-17 ENCOUNTER — Encounter
Admission: RE | Disposition: A | Discharge status: Home / Self Care | Payer: Self-pay | Source: Home / Self Care | Attending: Gastroenterology

## 2022-09-17 ENCOUNTER — Ambulatory Visit
Admission: RE | Admit: 2022-09-17 | Discharge: 2022-09-17 | Disposition: A | Discharge status: Home / Self Care | Payer: BLUE CROSS/BLUE SHIELD | Attending: Gastroenterology | Admitting: Gastroenterology

## 2022-09-17 ENCOUNTER — Encounter: Payer: Self-pay | Admitting: *Deleted

## 2022-09-17 DIAGNOSIS — D122 Benign neoplasm of ascending colon: Secondary | ICD-10-CM | POA: Insufficient documentation

## 2022-09-17 DIAGNOSIS — D123 Benign neoplasm of transverse colon: Secondary | ICD-10-CM | POA: Insufficient documentation

## 2022-09-17 DIAGNOSIS — D124 Benign neoplasm of descending colon: Secondary | ICD-10-CM | POA: Insufficient documentation

## 2022-09-17 DIAGNOSIS — Z8632 Personal history of gestational diabetes: Secondary | ICD-10-CM | POA: Insufficient documentation

## 2022-09-17 DIAGNOSIS — Z6841 Body Mass Index (BMI) 40.0 and over, adult: Secondary | ICD-10-CM | POA: Insufficient documentation

## 2022-09-17 DIAGNOSIS — D125 Benign neoplasm of sigmoid colon: Secondary | ICD-10-CM | POA: Diagnosis not present

## 2022-09-17 DIAGNOSIS — Z1211 Encounter for screening for malignant neoplasm of colon: Secondary | ICD-10-CM | POA: Insufficient documentation

## 2022-09-17 HISTORY — PX: COLONOSCOPY WITH PROPOFOL: SHX5780

## 2022-09-17 LAB — POCT PREGNANCY, URINE: Preg Test, Ur: NEGATIVE

## 2022-09-17 SURGERY — COLONOSCOPY WITH PROPOFOL
Anesthesia: General

## 2022-09-17 MED ORDER — PROPOFOL 10 MG/ML IV BOLUS
INTRAVENOUS | Status: DC | PRN
  Administered 2022-09-17 (×8): 20 mg via INTRAVENOUS
  Administered 2022-09-17: 50 mg via INTRAVENOUS
  Administered 2022-09-17: 20 mg via INTRAVENOUS
  Administered 2022-09-17: 30 mg via INTRAVENOUS

## 2022-09-17 MED ORDER — SODIUM CHLORIDE 0.9 % IV SOLN: INTRAVENOUS | Status: DC

## 2022-09-17 MED ORDER — EPHEDRINE SULFATE (PRESSORS) 50 MG/ML IJ SOLN
INTRAMUSCULAR | Status: DC | PRN
  Administered 2022-09-17 (×2): 10 mg via INTRAVENOUS

## 2022-09-17 MED ORDER — LIDOCAINE HCL (PF) 2 % IJ SOLN
INTRAMUSCULAR | Status: DC | PRN
  Administered 2022-09-17: 30 mg via INTRADERMAL

## 2022-09-17 NOTE — Anesthesia Postprocedure Evaluation (Signed)
Anesthesia Post Note  Patient: Robin Morrison  Procedure(s) Performed: COLONOSCOPY WITH PROPOFOL  Patient location during evaluation: PACU Anesthesia Type: General Level of consciousness: awake and alert, oriented and patient cooperative Pain management: pain level controlled Vital Signs Assessment: post-procedure vital signs reviewed and stable Respiratory status: spontaneous breathing, nonlabored ventilation and respiratory function stable Cardiovascular status: blood pressure returned to baseline and stable Postop Assessment: adequate PO intake Anesthetic complications: no   No notable events documented.   Last Vitals:  Vitals:   09/17/22 1105 09/17/22 1117  BP:  118/70  Pulse:  66  Resp: 16 16  Temp:    SpO2:  99%    Last Pain:  Vitals:   09/17/22 1117  TempSrc:   PainSc: 0-No pain                 Reed Breech

## 2022-09-17 NOTE — Interval H&P Note (Signed)
History and Physical Interval Note:  09/17/2022 10:19 AM  Robin Morrison  has presented today for surgery, with the diagnosis of colon cancer screening.  The various methods of treatment have been discussed with the patient and family. After consideration of risks, benefits and other options for treatment, the patient has consented to  Procedure(s) with comments: COLONOSCOPY WITH PROPOFOL (N/A) - Spanish Interpreter request entered with arrival time of 12:30pm as a surgical intervention.  The patient's history has been reviewed, patient examined, no change in status, stable for surgery.  I have reviewed the patient's chart and labs.  Questions were answered to the patient's satisfaction.     Regis Bill  Ok to proceed with colonoscopy

## 2022-09-17 NOTE — Anesthesia Preprocedure Evaluation (Addendum)
Anesthesia Evaluation  Patient identified by MRN, date of birth, ID band Patient awake    Reviewed: Allergy & Precautions, NPO status , Patient's Chart, lab work & pertinent test results  History of Anesthesia Complications Negative for: history of anesthetic complications  Airway Mallampati: IV   Neck ROM: Full    Dental  (+) Missing   Pulmonary neg pulmonary ROS   Pulmonary exam normal breath sounds clear to auscultation       Cardiovascular Exercise Tolerance: Good negative cardio ROS Normal cardiovascular exam Rhythm:Regular Rate:Normal     Neuro/Psych negative neurological ROS     GI/Hepatic negative GI ROS,,,  Endo/Other  diabetes, Type 2  Obesity  Renal/GU negative Renal ROS     Musculoskeletal   Abdominal   Peds  Hematology negative hematology ROS (+)   Anesthesia Other Findings   Reproductive/Obstetrics                             Anesthesia Physical Anesthesia Plan  ASA: 2  Anesthesia Plan: General   Post-op Pain Management:    Induction: Intravenous  PONV Risk Score and Plan: 3 and Propofol infusion, TIVA and Treatment may vary due to age or medical condition  Airway Management Planned: Natural Airway  Additional Equipment:   Intra-op Plan:   Post-operative Plan:   Informed Consent: I have reviewed the patients History and Physical, chart, labs and discussed the procedure including the risks, benefits and alternatives for the proposed anesthesia with the patient or authorized representative who has indicated his/her understanding and acceptance.       Plan Discussed with: CRNA  Anesthesia Plan Comments: (History and consent obtained in Spanish.  LMA/GETA backup discussed.  Patient consented for risks of anesthesia including but not limited to:  - adverse reactions to medications - damage to eyes, teeth, lips or other oral mucosa - nerve damage due to  positioning  - sore throat or hoarseness - damage to heart, brain, nerves, lungs, other parts of body or loss of life  Informed patient about role of CRNA in peri- and intra-operative care.  Patient voiced understanding.)       Anesthesia Quick Evaluation

## 2022-09-17 NOTE — Op Note (Signed)
[facility] Patient Name: Robin Morrison Procedure Date: 09/17/2022 10:16 AM MRN: [id number] Account #: [id number] Date of Birth: [birth year] Admit Type: Outpatient Age: 47 Room: [facility] ENDO ROOM 3 Gender: Female Note Status: Finalized Instrument Name: Prentice Docker [id number] Procedure:             Colonoscopy Indications:           Screening for colorectal malignant neoplasm Providers:             Eather Colas MD, MD Medicines:             Monitored Anesthesia Care Complications:         No immediate complications. Estimated blood loss:                         Minimal. Procedure:             Pre-Anesthesia Assessment:                        - Prior to the procedure, a History and Physical was                         performed, and patient medications and allergies were                         reviewed. The patient is competent. The risks and                         benefits of the procedure and the sedation options and                         risks were discussed with the patient. All questions                         were answered and informed consent was obtained.                         Patient identification and proposed procedure were                         verified by the physician, the nurse, the                         anesthesiologist, the anesthetist and the technician                         in the endoscopy suite. Mental Status Examination:                         alert and oriented. Airway Examination: normal                         oropharyngeal airway and neck mobility. Respiratory                         Examination: clear to auscultation. CV Examination:                         normal. Prophylactic Antibiotics: The patient does not  require prophylactic antibiotics. Prior                         Anticoagulants: The patient has taken no anticoagulant                         or antiplatelet agents. ASA Grade  Assessment: II - A                         patient with mild systemic disease. After reviewing                         the risks and benefits, the patient was deemed in                         satisfactory condition to undergo the procedure. The                         anesthesia plan was to use monitored anesthesia care                         (MAC). Immediately prior to administration of                         medications, the patient was re-assessed for adequacy                         to receive sedatives. The heart rate, respiratory                         rate, oxygen saturations, blood pressure, adequacy of                         pulmonary ventilation, and response to care were                         monitored throughout the procedure. The physical                         status of the patient was re-assessed after the                         procedure.                        After obtaining informed consent, the colonoscope was                         passed under direct vision. Throughout the procedure,                         the patient's blood pressure, pulse, and oxygen                         saturations were monitored continuously. The                         Colonoscope was introduced through the anus and  advanced to the the terminal ileum. The colonoscopy                         was performed without difficulty. The patient                         tolerated the procedure well. The quality of the bowel                         preparation was good. The terminal ileum, ileocecal                         valve, appendiceal orifice, and rectum were                         photographed. Findings:      The perianal and digital rectal examinations were normal.      The terminal ileum appeared normal.      A 3 mm polyp was found in the ascending colon. The polyp was sessile.       The polyp was removed with a cold snare. Resection and retrieval were        complete. Estimated blood loss was minimal.      A 1 mm polyp was found in the transverse colon. The polyp was sessile.       The polyp was removed with a cold snare. Resection and retrieval were       complete. Estimated blood loss was minimal.      A 2 mm polyp was found in the descending colon. The polyp was sessile.       The polyp was removed with a jumbo cold forceps. Resection and retrieval       were complete. Estimated blood loss was minimal.      A 4 mm polyp was found in the sigmoid colon. The polyp was sessile. The       polyp was removed with a cold snare. Resection and retrieval were       complete. Estimated blood loss was minimal.      Internal hemorrhoids were found during retroflexion. The hemorrhoids       were Grade I (internal hemorrhoids that do not prolapse).      The exam was otherwise without abnormality on direct and retroflexion       views. Impression:            - The examined portion of the ileum was normal.                        - One 3 mm polyp in the ascending colon, removed with                         a cold snare. Resected and retrieved.                        - One 1 mm polyp in the transverse colon, removed with                         a cold snare. Resected and retrieved.                        -  One 2 mm polyp in the descending colon, removed with                         a jumbo cold forceps. Resected and retrieved.                        - One 4 mm polyp in the sigmoid colon, removed with a                         cold snare. Resected and retrieved.                        - Internal hemorrhoids.                        - The examination was otherwise normal on direct and                         retroflexion views. Recommendation:        - Discharge patient to home.                        - Resume previous diet.                        - Continue present medications.                        - Await pathology results.                        - Repeat  colonoscopy in 3 - 5 years for surveillance.                        - Return to referring physician as previously                         scheduled. Procedure Code(s):     --- Professional ---                        [phone], Colonoscopy, flexible; with removal of                         tumor(s), polyp(s), or other lesion(s) by snare                         technique                        [id number], 59, Colonoscopy, flexible; with biopsy, single                         or multiple Diagnosis Code(s):     --- Professional ---                        Z12.11, Encounter for screening for malignant neoplasm                         of colon  K64.0, First degree hemorrhoids                        D12.2, Benign neoplasm of ascending colon                        D12.3, Benign neoplasm of transverse colon (hepatic                         flexure or splenic flexure)                        D12.4, Benign neoplasm of descending colon                        D12.5, Benign neoplasm of sigmoid colon CPT copyright 2022 American Medical Association. All rights reserved. The codes documented in this report are preliminary and upon coder review may  be revised to meet current compliance requirements. Eather Colas MD, MD 09/17/2022 10:50:53 AM Number of Addenda: 0 Note Initiated On: 09/17/2022 10:16 AM Scope Withdrawal Time: 0 hours 11 minutes 16 seconds  Total Procedure Duration: 0 hours 19 minutes 59 seconds  Estimated Blood Loss:  Estimated blood loss was minimal.      [facility]

## 2022-09-17 NOTE — H&P (Signed)
Outpatient short stay form Pre-procedure 09/17/2022  Regis Bill, MD  Primary Physician: System, Provider Not In  Reason for visit:  Screening  History of present illness:    47 y/o lady with history of obesity here for index screening colonoscopy. No blood thinners. No family history of GI malignancies. No significant abdominal surgeries.    Current Facility-Administered Medications:    0.9 %  sodium chloride infusion, , Intravenous, Continuous, Anaiyah Anglemyer, Rossie Muskrat, MD, Last Rate: 20 mL/hr at 09/17/22 1005, New Bag at 09/17/22 1005  Medications Prior to Admission  Medication Sig Dispense Refill Last Dose   nystatin-triamcinolone ointment (MYCOLOG) Apply 1 application topically 2 (two) times daily. For 30 days 60 g 0      No Known Allergies   Past Medical History:  Diagnosis Date   Advanced maternal age in multigravida    Gestational diabetes mellitus, class A2 05/2014   Morbid obesity with BMI of 40.0-44.9, adult     Review of systems:  Otherwise negative.    Physical Exam  Gen: Alert, oriented. Appears stated age.  HEENT: PERRLA. Lungs: No respiratory distress CV: RRR Abd: soft, benign, no masses Ext: No edema    Planned procedures: Proceed with colonoscopy. The patient understands the nature of the planned procedure, indications, risks, alternatives and potential complications including but not limited to bleeding, infection, perforation, damage to internal organs and possible oversedation/side effects from anesthesia. The patient agrees and gives consent to proceed.  Please refer to procedure notes for findings, recommendations and patient disposition/instructions.     Regis Bill, MD [facility]

## 2022-09-17 NOTE — Transfer of Care (Signed)
Immediate Anesthesia Transfer of Care Note  Patient: Robin Morrison  Procedure(s) Performed: COLONOSCOPY WITH PROPOFOL  Patient Location: PACU and Endoscopy Unit  Anesthesia Type:MAC  Level of Consciousness: awake  Airway & Oxygen Therapy: Patient Spontanous Breathing and Patient connected to nasal cannula oxygen  Post-op Assessment: Report given to RN and Post -op Vital signs reviewed and stable  Post vital signs: Reviewed and stable  Last Vitals:  Vitals Value Taken Time  BP 119/57 09/17/22 1050  Temp    Pulse 62 09/17/22 1051  Resp 16 09/17/22 1051  SpO2 100 % 09/17/22 1051  Vitals shown include unvalidated device data.  Last Pain:  Vitals:   09/17/22 1050  TempSrc:   PainSc: 0-No pain         Complications: No notable events documented.

## 2022-09-20 ENCOUNTER — Encounter: Payer: Self-pay | Admitting: Gastroenterology

## 2022-09-20 LAB — SURGICAL PATHOLOGY

## 2023-05-27 ENCOUNTER — Other Ambulatory Visit: Payer: Self-pay

## 2023-05-27 ENCOUNTER — Emergency Department
Admission: EM | Admit: 2023-05-27 | Discharge: 2023-05-27 | Disposition: A | Discharge status: Home / Self Care | Payer: BLUE CROSS/BLUE SHIELD | Attending: Emergency Medicine | Admitting: Emergency Medicine

## 2023-05-27 ENCOUNTER — Emergency Department: Payer: BLUE CROSS/BLUE SHIELD

## 2023-05-27 ENCOUNTER — Encounter: Payer: Self-pay | Admitting: Emergency Medicine

## 2023-05-27 DIAGNOSIS — R200 Anesthesia of skin: Secondary | ICD-10-CM | POA: Diagnosis present

## 2023-05-27 DIAGNOSIS — E119 Type 2 diabetes mellitus without complications: Secondary | ICD-10-CM | POA: Insufficient documentation

## 2023-05-27 DIAGNOSIS — R202 Paresthesia of skin: Secondary | ICD-10-CM | POA: Diagnosis not present

## 2023-05-27 LAB — POC URINE PREG, ED: Preg Test, Ur: NEGATIVE

## 2023-05-27 LAB — TROPONIN I (HIGH SENSITIVITY)
Troponin I (High Sensitivity): <2 ng/L (ref <18)
Troponin I (High Sensitivity): 3 ng/L (ref <18)

## 2023-05-27 LAB — BASIC METABOLIC PANEL
Anion gap: 9 (ref 5–15)
BUN: 15 mg/dL (ref 6–20)
CO2: 24 mmol/L (ref 22–32)
Calcium: 8.8 mg/dL — ABNORMAL LOW (ref 8.9–10.3)
Chloride: 101 mmol/L (ref 98–111)
Creatinine, Ser: 0.97 mg/dL (ref 0.44–1.00)
GFR, Estimated: >60 mL/min (ref >60)
Glucose, Bld: 256 mg/dL — ABNORMAL HIGH (ref 70–99)
Potassium: 3.9 mmol/L (ref 3.5–5.1)
Sodium: 134 mmol/L — ABNORMAL LOW (ref 135–145)

## 2023-05-27 LAB — CBC
HCT: 44.1 % (ref 36.0–46.0)
Hemoglobin: 14.8 g/dL (ref 12.0–15.0)
MCH: 32 pg (ref 26.0–34.0)
MCHC: 33.6 g/dL (ref 30.0–36.0)
MCV: 95.2 fL (ref 80.0–100.0)
Platelets: 311 10*3/uL (ref 150–400)
RBC: 4.63 MIL/uL (ref 3.87–5.11)
RDW: 12.6 % (ref 11.5–15.5)
WBC: 9.3 10*3/uL (ref 4.0–10.5)
nRBC: 0 % (ref 0.0–0.2)

## 2023-05-27 NOTE — ED Notes (Signed)
Denies any needs. Family at bedside. Encouraged to alert staff to any needs.

## 2023-05-27 NOTE — ED Triage Notes (Signed)
Says for a few days feells numb in left side of face and left arm.  It goes away.  No facial droop.

## 2023-05-27 NOTE — ED Provider Notes (Cosign Needed)
-----------------------------------------   7:46 PM on 05/27/2023 -----------------------------------------  Blood pressure 122/70, pulse 67, temperature 98.2 F (36.8 C), temperature source Oral, resp. rate 18, weight 97.1 kg, last menstrual period 04/11/2023, SpO2 99%.  Assuming care from Dr. Anner Crete.  In short, Robin Morrison is a 47 y.o. female with a chief complaint of No chief complaint on file. Marland Kitchen  Refer to the original H&P for additional details.  The current plan of care is to await results of MRI.  ----------------------------------------- 9:47 PM on 05/27/2023 -----------------------------------------  MRI shows subacute changes however no acute concerns.  She will be discharged home with instruction to follow-up with neurology.  If any symptom changes or worsens and she is unable to see her primary care provider or the neurologist, she is to return to the emergency department.   Chinita Pester, FNP 05/27/23 2148

## 2023-05-27 NOTE — Discharge Instructions (Signed)
Your MRI does not show any evidence that you are having a stroke today.  Please call and schedule a follow-up appointment with neurology.  If your symptoms change or get worse and you are unable to see your primary care provider or the neurologist, please return to the emergency department.

## 2023-05-27 NOTE — ED Notes (Signed)
Pt to MRI

## 2023-05-27 NOTE — ED Provider Notes (Signed)
[facility] Provider Note    Event Date/Time   First MD Initiated Contact with Patient 05/27/23 1703     (approximate)   History   No chief complaint on file.   HPI Robin Morrison is a 47 y.o. female with DM2 presenting today for facial numbness.  Patient states over the last 5 days she has had numbness to the left side of her face and left arm that is intermittent.  Does feel it is present at this time.  No obvious alleviating or exacerbating factors.  Sometimes has weakness in her left upper extremity as well.  They deny any numbness or weakness to any other extremities.  No vision changes, headaches, speech changes, facial droop.  No prior history of stroke.     Physical Exam   Triage Vital Signs: ED Triage Vitals  Encounter Vitals Group     BP 05/27/23 1204 133/81     Systolic BP Percentile --      Diastolic BP Percentile --      Pulse Rate 05/27/23 1204 75     Resp 05/27/23 1204 16     Temp 05/27/23 1204 99 F (37.2 C)     Temp Source 05/27/23 1204 Oral     SpO2 05/27/23 1204 97 %     Weight 05/27/23 1205 214 lb (97.1 kg)     Height --      Head Circumference --      Peak Flow --      Pain Score 05/27/23 1204 6     Pain Loc --      Pain Education --      Exclude from Growth Chart --     Most recent vital signs: Vitals:   05/27/23 1204 05/27/23 1652  BP: 133/81 122/70  Pulse: 75 67  Resp: 16 18  Temp: 99 F (37.2 C) 98.2 F (36.8 C)  SpO2: 97% 99%   Physical Exam: I have reviewed the vital signs and nursing notes. General: Awake, alert, no acute distress.  Nontoxic appearing. Head:  Atraumatic, normocephalic.   ENT:  EOM intact, PERRL. Oral mucosa is pink and moist with no lesions. Neck: Neck is supple with full range of motion, No meningeal signs. Cardiovascular:  RRR, No murmurs. Peripheral pulses palpable and equal bilaterally. Respiratory:  Symmetrical chest wall expansion.  No rhonchi, rales, or wheezes.  Good air movement  throughout.  No use of accessory muscles.   Musculoskeletal:  No cyanosis or edema. Moving extremities with full ROM Abdomen:  Soft, nontender, nondistended. Neuro:  GCS 15, moving all four extremities, interacting appropriately. Alert and oriented with cogent speech; 5/5 motor strength all 4 extremities with intact peripheral nerve distributions; subjectively states sensory changes to left upper extremity from mid humerus all the way down to hand with normal sensation to right upper extremity.  Sensation equal intact bilateral lower extremities; no clonus; cerebellar examination normal including Romberg, finger-to-nose, heel-to-shin, and without trunkal ataxia, dysdiadochokinesis or dysmetria; cranial nerves 2-12 intact to gross challenge bilaterally except for perceived sensation diminished to left side of face in comparison to the right Psych:  Calm, appropriate.   Skin:  Warm, dry, no rash.    ED Results / Procedures / Treatments   Labs (all labs ordered are listed, but only abnormal results are displayed) Labs Reviewed  BASIC METABOLIC PANEL - Abnormal; Notable for the following components:      Result Value   Sodium 134 (*)    Glucose, Bld  256 (*)    Calcium 8.8 (*)    All other components within normal limits  CBC  POC URINE PREG, ED  TROPONIN I (HIGH SENSITIVITY)  TROPONIN I (HIGH SENSITIVITY)     EKG My EKG interpretation: Rate of 72, normal sinus rhythm, normal axis, normal intervals.  No acute ST elevations or depressions   RADIOLOGY MRI brain pending at time of signout   PROCEDURES:  Critical Care performed: No  Procedures   MEDICATIONS ORDERED IN ED: Medications - No data to display   IMPRESSION / MDM / ASSESSMENT AND PLAN / ED COURSE  I reviewed the triage vital signs and the nursing notes.                              Differential diagnosis includes, but is not limited to, CVA, peripheral nerve palsy, paresthesias, electrolyte abnormality  Patient's  presentation is most consistent with acute complicated illness / injury requiring diagnostic workup.  Patient is a 47 year old female presenting today for 5 days of intermittent left face and left arm paresthesias.  It is not completely numb on exam.  Slight sensation differences noted in comparison to the right side.  Otherwise, cranial nerves II through XII intact.  No other acute focal neurological deficits.  No weakness or vision changes.  Ambulating without ataxia.  Given chronicity of symptoms over 5 days, will get MRI to rule out any evidence of acute versus subacute CVA.  Laboratory workup otherwise unremarkable.  Patient signed out to oncoming provider pending results of MRI.  If this is negative and shows no acute stroke symptoms, she is otherwise stable and likely can be discharged with outpatient neurology follow-up.     FINAL CLINICAL IMPRESSION(S) / ED DIAGNOSES   Final diagnoses:  Facial paresthesia  Paresthesia of left arm     Rx / DC Orders   ED Discharge Orders     None        Note:  This document was prepared using Dragon voice recognition software and may include unintentional dictation errors.   Janith Lima, MD 05/27/23 2003

## 2023-11-30 ENCOUNTER — Other Ambulatory Visit: Payer: Self-pay | Admitting: Sports Medicine

## 2023-11-30 DIAGNOSIS — M75101 Unspecified rotator cuff tear or rupture of right shoulder, not specified as traumatic: Secondary | ICD-10-CM

## 2023-11-30 DIAGNOSIS — M7531 Calcific tendinitis of right shoulder: Secondary | ICD-10-CM

## 2023-11-30 DIAGNOSIS — G8929 Other chronic pain: Secondary | ICD-10-CM

## 2023-11-30 DIAGNOSIS — M7541 Impingement syndrome of right shoulder: Secondary | ICD-10-CM

## 2023-12-12 ENCOUNTER — Other Ambulatory Visit: Payer: Self-pay | Admitting: Certified Nurse Midwife

## 2023-12-12 DIAGNOSIS — Z1231 Encounter for screening mammogram for malignant neoplasm of breast: Secondary | ICD-10-CM

## 2023-12-22 IMAGING — MG MM DIGITAL SCREENING BILAT W/ TOMO AND CAD
6 of 10 series · 6 of 30 positions shown · non-contrast
Comparison: Previous exam(s).

CLINICAL DATA: Screening.

EXAM:
DIGITAL SCREENING BILATERAL MAMMOGRAM WITH TOMOSYNTHESIS AND CAD
TECHNIQUE: Bilateral screening digital craniocaudal and mediolateral oblique
mammograms were obtained. Bilateral screening digital breast
tomosynthesis was performed. The images were evaluated with
computer-aided detection.

[R CC synth-2D (1 of 2)]
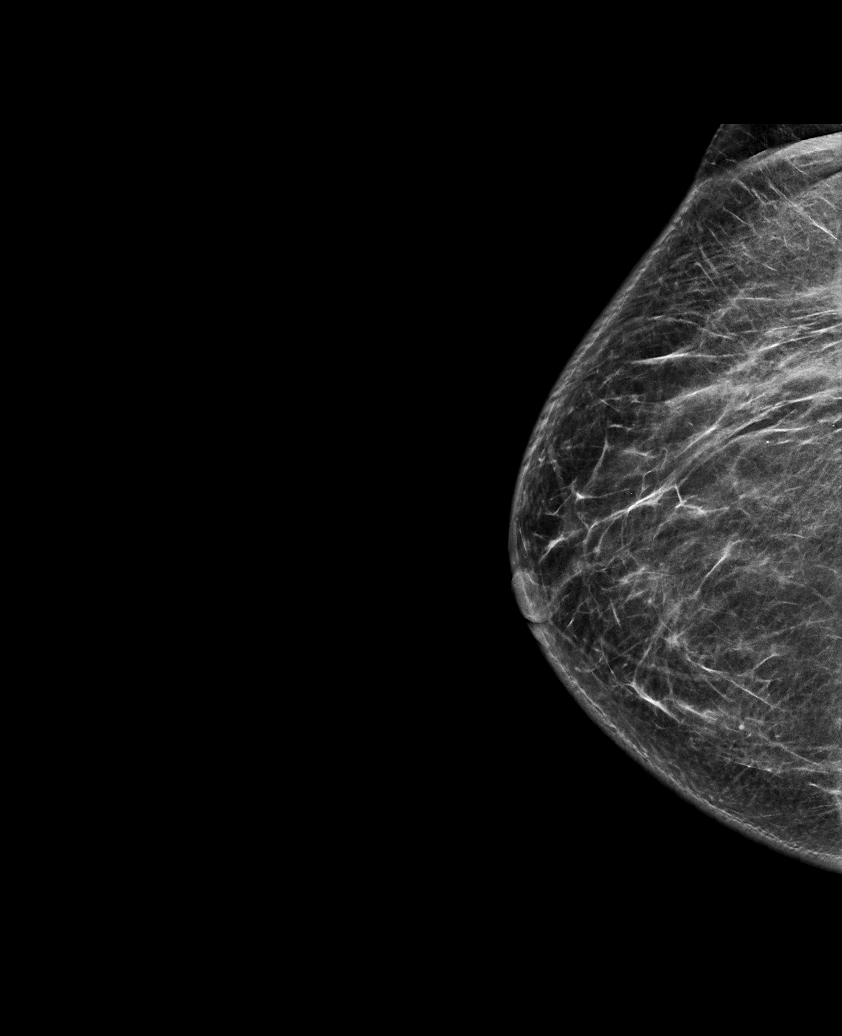

[R MLO synth-2D]
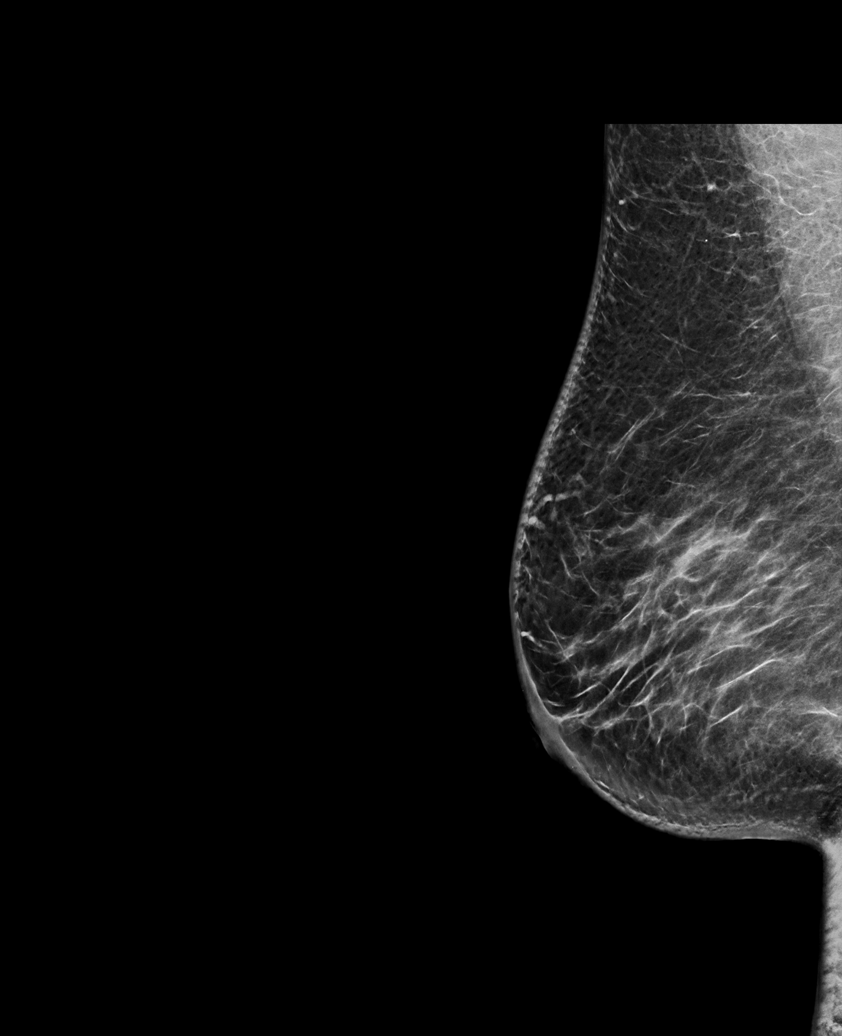

[R CC synth-2D (2 of 2)]
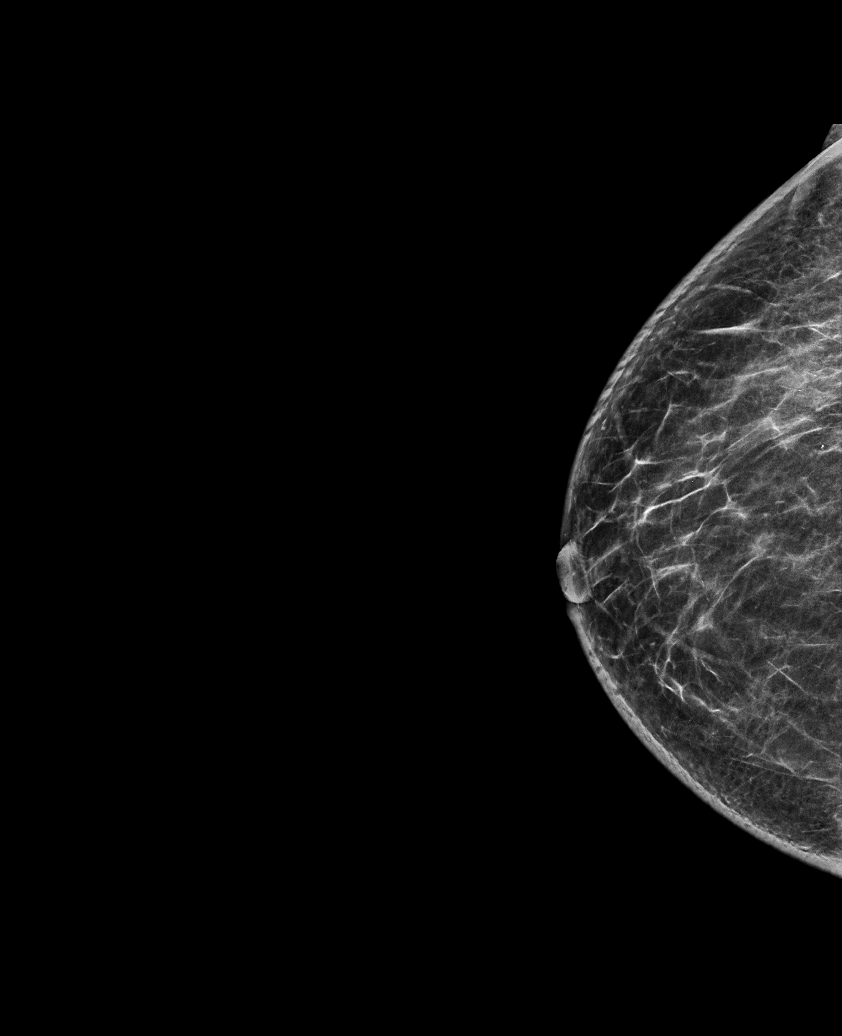

[L MLO synth-2D]
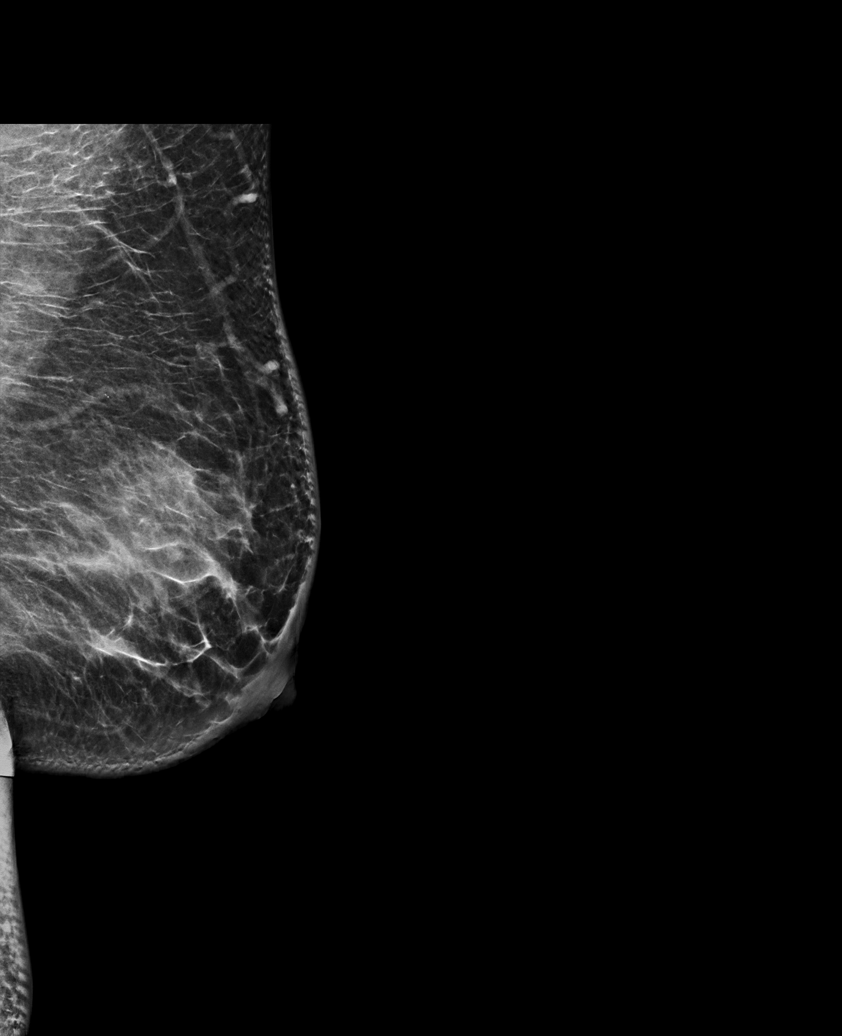

[L CC synth-2D]
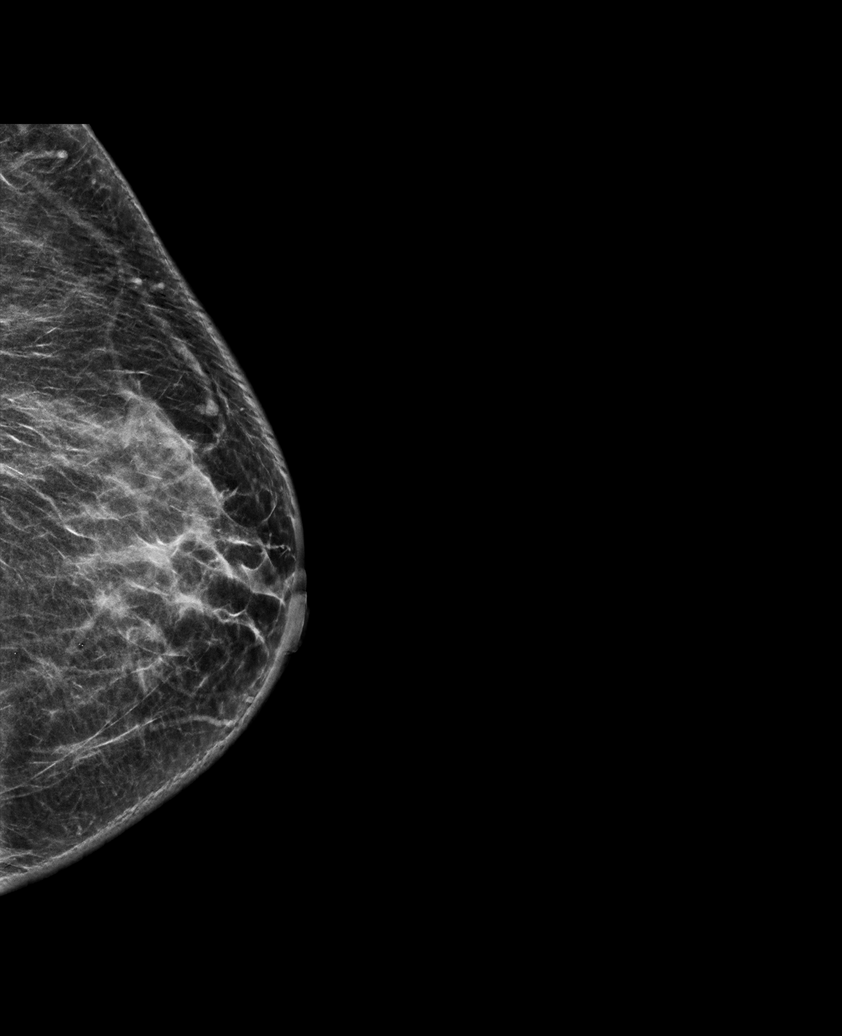

[R CC tomo · tomo slice 35/69.0]
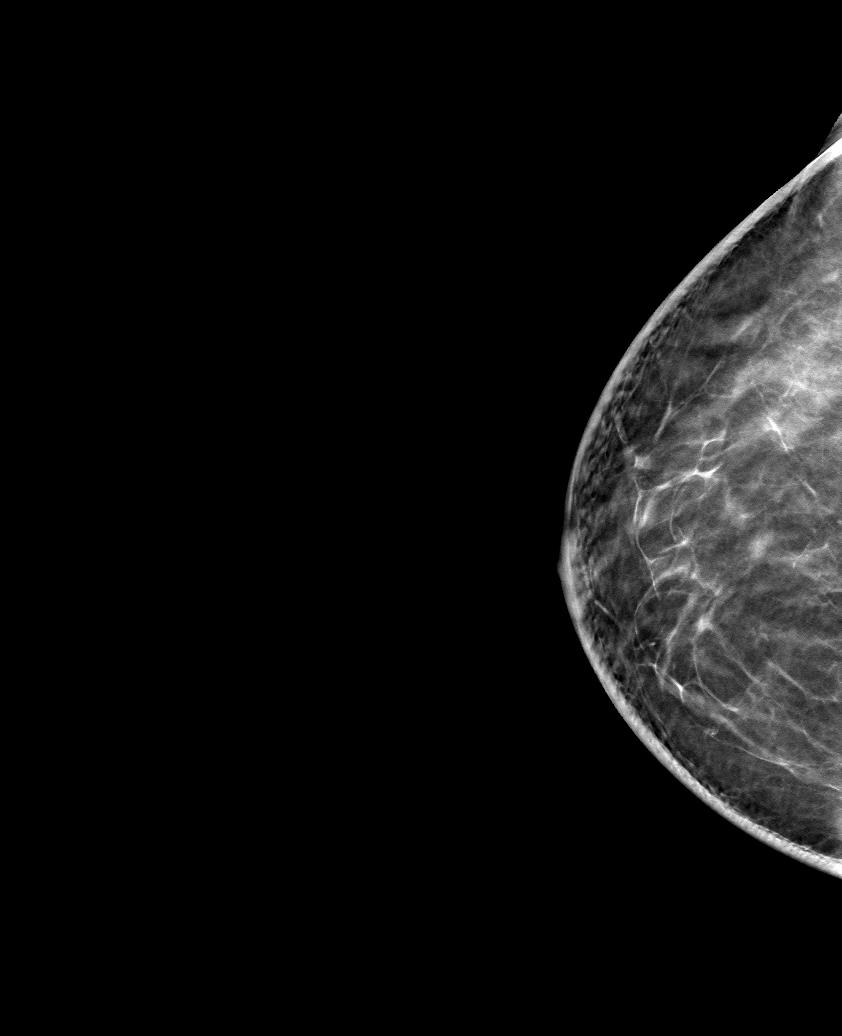

[6 of 30 positions shown; findings below may reference images not displayed]

ACR Breast Density Category c: The breast tissue is heterogeneously
dense, which may obscure small masses.
FINDINGS: There are no findings suspicious for malignancy.
IMPRESSION: No mammographic evidence of malignancy. A result letter of this
screening mammogram will be mailed directly to the patient.

RECOMMENDATION:
Screening mammogram in one year. (Code:Q3-W-BC3)

BI-RADS CATEGORY  1: Negative.

## 2024-02-29 ENCOUNTER — Other Ambulatory Visit: Payer: Self-pay | Admitting: Gastroenterology

## 2024-02-29 DIAGNOSIS — K760 Fatty (change of) liver, not elsewhere classified: Secondary | ICD-10-CM

## 2024-03-05 ENCOUNTER — Other Ambulatory Visit: Payer: Self-pay

## 2024-03-05 DIAGNOSIS — K689 Other disorders of retroperitoneum: Secondary | ICD-10-CM

## 2024-03-05 DIAGNOSIS — Z23 Encounter for immunization: Secondary | ICD-10-CM

## 2024-03-13 ENCOUNTER — Ambulatory Visit
Admission: RE | Admit: 2024-03-13 | Discharge: 2024-03-13 | Disposition: A | Discharge status: Home / Self Care | Source: Ambulatory Visit | Attending: Gastroenterology | Admitting: Gastroenterology

## 2024-03-13 ENCOUNTER — Ambulatory Visit

## 2024-03-13 ENCOUNTER — Ambulatory Visit
Admission: RE | Admit: 2024-03-13 | Discharge: 2024-03-13 | Disposition: A | Discharge status: Home / Self Care | Source: Ambulatory Visit

## 2024-03-13 DIAGNOSIS — Z23 Encounter for immunization: Secondary | ICD-10-CM

## 2024-03-13 DIAGNOSIS — K760 Fatty (change of) liver, not elsewhere classified: Secondary | ICD-10-CM | POA: Insufficient documentation

## 2024-03-13 DIAGNOSIS — K689 Other disorders of retroperitoneum: Secondary | ICD-10-CM
